# Patient Record
Sex: Female | Born: 1970 | Race: White | Hispanic: No | Smoking: Never smoker
Health system: Southern US, Community
[De-identification: ages and names within clinical notes are randomized; demographics above are authoritative.]

## PROBLEM LIST (undated history)

## (undated) ENCOUNTER — Emergency Department (HOSPITAL_COMMUNITY): Admission: EM | Payer: PRIVATE HEALTH INSURANCE | Source: Home / Self Care

## (undated) HISTORY — PX: TONSILLECTOMY AND ADENOIDECTOMY: SHX28

## (undated) HISTORY — PX: DILATION AND CURETTAGE OF UTERUS: SHX78

---

## 2000-05-04 DIAGNOSIS — R87629 Unspecified abnormal cytological findings in specimens from vagina: Secondary | ICD-10-CM

## 2000-05-04 HISTORY — DX: Unspecified abnormal cytological findings in specimens from vagina: R87.629

## 2000-09-06 ENCOUNTER — Other Ambulatory Visit: Admission: RE | Admit: 2000-09-06 | Discharge: 2000-09-06 | Payer: Self-pay | Admitting: Family Medicine

## 2000-10-13 ENCOUNTER — Other Ambulatory Visit: Admission: RE | Admit: 2000-10-13 | Discharge: 2000-10-13 | Payer: Self-pay | Admitting: Obstetrics and Gynecology

## 2004-12-15 ENCOUNTER — Ambulatory Visit: Payer: Self-pay | Admitting: Orthopedic Surgery

## 2010-04-08 ENCOUNTER — Ambulatory Visit (HOSPITAL_COMMUNITY)
Admission: RE | Admit: 2010-04-08 | Discharge: 2010-04-08 | Payer: Self-pay | Source: Home / Self Care | Admitting: Obstetrics and Gynecology

## 2012-02-01 ENCOUNTER — Other Ambulatory Visit (HOSPITAL_COMMUNITY): Payer: Self-pay | Admitting: Unknown Physician Specialty

## 2012-02-01 DIAGNOSIS — Z139 Encounter for screening, unspecified: Secondary | ICD-10-CM

## 2012-02-05 ENCOUNTER — Ambulatory Visit (HOSPITAL_COMMUNITY)
Admission: RE | Admit: 2012-02-05 | Discharge: 2012-02-05 | Disposition: A | Discharge status: Home / Self Care | Payer: PRIVATE HEALTH INSURANCE | Source: Ambulatory Visit | Attending: Unknown Physician Specialty | Admitting: Unknown Physician Specialty

## 2012-02-05 DIAGNOSIS — Z1231 Encounter for screening mammogram for malignant neoplasm of breast: Secondary | ICD-10-CM | POA: Insufficient documentation

## 2012-02-05 DIAGNOSIS — Z139 Encounter for screening, unspecified: Secondary | ICD-10-CM

## 2013-01-23 ENCOUNTER — Other Ambulatory Visit (HOSPITAL_COMMUNITY): Payer: Self-pay | Admitting: Unknown Physician Specialty

## 2013-01-23 DIAGNOSIS — Z139 Encounter for screening, unspecified: Secondary | ICD-10-CM

## 2013-02-06 ENCOUNTER — Ambulatory Visit (HOSPITAL_COMMUNITY)
Admission: RE | Admit: 2013-02-06 | Discharge: 2013-02-06 | Disposition: A | Discharge status: Home / Self Care | Payer: PRIVATE HEALTH INSURANCE | Source: Ambulatory Visit | Attending: Unknown Physician Specialty | Admitting: Unknown Physician Specialty

## 2013-02-06 DIAGNOSIS — Z1231 Encounter for screening mammogram for malignant neoplasm of breast: Secondary | ICD-10-CM | POA: Insufficient documentation

## 2013-02-06 DIAGNOSIS — Z139 Encounter for screening, unspecified: Secondary | ICD-10-CM

## 2014-01-23 ENCOUNTER — Other Ambulatory Visit (HOSPITAL_COMMUNITY): Payer: Self-pay | Admitting: Unknown Physician Specialty

## 2014-01-23 DIAGNOSIS — Z139 Encounter for screening, unspecified: Secondary | ICD-10-CM

## 2014-02-09 ENCOUNTER — Ambulatory Visit (HOSPITAL_COMMUNITY)
Admission: RE | Admit: 2014-02-09 | Discharge: 2014-02-09 | Disposition: A | Discharge status: Home / Self Care | Payer: PRIVATE HEALTH INSURANCE | Source: Ambulatory Visit | Attending: Unknown Physician Specialty | Admitting: Unknown Physician Specialty

## 2014-02-09 DIAGNOSIS — Z139 Encounter for screening, unspecified: Secondary | ICD-10-CM

## 2014-02-09 DIAGNOSIS — Z1231 Encounter for screening mammogram for malignant neoplasm of breast: Secondary | ICD-10-CM | POA: Diagnosis not present

## 2015-01-15 ENCOUNTER — Other Ambulatory Visit (HOSPITAL_COMMUNITY): Payer: Self-pay | Admitting: Unknown Physician Specialty

## 2015-01-15 DIAGNOSIS — Z1231 Encounter for screening mammogram for malignant neoplasm of breast: Secondary | ICD-10-CM

## 2015-02-13 ENCOUNTER — Ambulatory Visit (HOSPITAL_COMMUNITY)
Admission: RE | Admit: 2015-02-13 | Discharge: 2015-02-13 | Disposition: A | Discharge status: Home / Self Care | Payer: PRIVATE HEALTH INSURANCE | Source: Ambulatory Visit | Attending: Unknown Physician Specialty | Admitting: Unknown Physician Specialty

## 2015-02-13 DIAGNOSIS — Z1231 Encounter for screening mammogram for malignant neoplasm of breast: Secondary | ICD-10-CM | POA: Diagnosis not present

## 2018-06-06 ENCOUNTER — Other Ambulatory Visit (HOSPITAL_COMMUNITY): Payer: Self-pay | Admitting: Family Medicine

## 2018-06-06 DIAGNOSIS — Z1231 Encounter for screening mammogram for malignant neoplasm of breast: Secondary | ICD-10-CM

## 2018-06-20 ENCOUNTER — Ambulatory Visit (HOSPITAL_COMMUNITY)
Admission: RE | Admit: 2018-06-20 | Discharge: 2018-06-20 | Disposition: A | Discharge status: Home / Self Care | Payer: 59 | Source: Ambulatory Visit | Attending: Family Medicine | Admitting: Family Medicine

## 2018-06-20 DIAGNOSIS — Z1231 Encounter for screening mammogram for malignant neoplasm of breast: Secondary | ICD-10-CM | POA: Insufficient documentation

## 2019-02-10 ENCOUNTER — Other Ambulatory Visit: Payer: Self-pay | Admitting: *Deleted

## 2019-02-10 DIAGNOSIS — Z20822 Contact with and (suspected) exposure to covid-19: Secondary | ICD-10-CM

## 2019-02-11 LAB — NOVEL CORONAVIRUS, NAA: SARS-CoV-2, NAA: NOT DETECTED

## 2019-03-16 ENCOUNTER — Other Ambulatory Visit: Payer: Self-pay

## 2019-03-16 DIAGNOSIS — Z20822 Contact with and (suspected) exposure to covid-19: Secondary | ICD-10-CM

## 2019-03-18 ENCOUNTER — Telehealth: Payer: Self-pay

## 2019-03-18 LAB — NOVEL CORONAVIRUS, NAA: SARS-CoV-2, NAA: NOT DETECTED

## 2019-03-18 NOTE — Telephone Encounter (Signed)
Pt called for results. Advised results are not back. Sent pt MyChart enrollment link.

## 2019-03-19 ENCOUNTER — Telehealth: Payer: Self-pay | Admitting: *Deleted

## 2019-03-19 NOTE — Telephone Encounter (Signed)
Pt notified that she is negative for covid-19. She voiced understanding. 

## 2019-07-19 ENCOUNTER — Ambulatory Visit (INDEPENDENT_AMBULATORY_CARE_PROVIDER_SITE_OTHER): Payer: 59 | Admitting: Adult Health

## 2019-07-19 ENCOUNTER — Other Ambulatory Visit: Payer: Self-pay

## 2019-07-19 ENCOUNTER — Encounter: Payer: Self-pay | Admitting: Adult Health

## 2019-07-19 VITALS — BP 132/78 | HR 90 | Ht 65.0 in | Wt 187.2 lb

## 2019-07-19 DIAGNOSIS — Z1211 Encounter for screening for malignant neoplasm of colon: Secondary | ICD-10-CM

## 2019-07-19 DIAGNOSIS — F329 Major depressive disorder, single episode, unspecified: Secondary | ICD-10-CM | POA: Insufficient documentation

## 2019-07-19 DIAGNOSIS — F32A Depression, unspecified: Secondary | ICD-10-CM | POA: Insufficient documentation

## 2019-07-19 DIAGNOSIS — Z01419 Encounter for gynecological examination (general) (routine) without abnormal findings: Secondary | ICD-10-CM

## 2019-07-19 DIAGNOSIS — Z1212 Encounter for screening for malignant neoplasm of rectum: Secondary | ICD-10-CM

## 2019-07-19 LAB — HEMOCCULT GUIAC POC 1CARD (OFFICE): Fecal Occult Blood, POC: NEGATIVE

## 2019-07-19 MED ORDER — ESCITALOPRAM OXALATE 10 MG PO TABS: 10.0000 mg | ORAL_TABLET | Freq: Every day | ORAL | 2 refills | Status: DC

## 2019-07-19 NOTE — Progress Notes (Signed)
Patient ID: Jennifer Sweeney, female   DOB: 1970-08-13, 49 y.o.   MRN: [id number] History of Present Illness: Jennifer Sweeney is a 49 year old white female,divorced, G5P3, in for a well woman gyn exam,she had pap with Dr Mora Appl last in 2019 or 2020.She is on OCs for hot flashes.She is new pt. She has tried other meds in the past for depression, Wellbutrin made her mean, did OK on lexapro.  PCP is Dr Phillips Odor.    Current Medications, Allergies, Past Medical History, Past Surgical History, Family History and Social History were reviewed in [employer] record.     Review of Systems:  Patient denies any headaches, hearing loss, fatigue, blurred vision, shortness of breath, chest pain, abdominal pain, problems with bowel movements, urination, or intercourse(not active). No joint pain or mood swings. +depression.    Physical Exam:BP 132/78 (BP Location: Left Arm, Patient Position: Sitting, Cuff Size: Normal)   Pulse 90   Ht 5\' 5"  (1.651 m)   Wt 187 lb 3.2 oz (84.9 kg)   BMI 31.15 kg/m  General:  Well developed, well nourished, no acute distress Skin:  Warm and dry Neck:  Midline trachea, normal thyroid, good ROM, no lymphadenopathy Lungs; Clear to auscultation bilaterally Breast:  No dominant palpable mass, retraction, or nipple discharge Cardiovascular: Regular rate and rhythm Abdomen:  Soft, non tender, no hepatosplenomegaly Pelvic:  External genitalia is normal in appearance, no lesions.  The vagina is normal in appearance. Urethra has no lesions or masses. The cervix is smooth.  Uterus is felt to be normal size, shape, and contour.  No adnexal masses or tenderness noted.Bladder is non tender, no masses felt. Rectal: Good sphincter tone, no polyps, or hemorrhoids felt.  Hemoccult negative. Extremities/musculoskeletal:  No swelling or varicosities noted, no clubbing or cyanosis Psych:  No mood changes, alert and cooperative,seems happy Fall risk is low PHQ 9 score is 16, is  open it meds no SI or HI. Examination chaperoned by CMA  Impression and Plan: 1. Encounter for well woman exam with routine gynecological exam Pap and physical in 1 year Labs with PCP Mammogram every year  2. Screening for colorectal cancer  3. Depression, unspecified depression type Will rx lexapro  Meds ordered this encounter  Medications  . escitalopram (LEXAPRO) 10 MG tablet    Sig: Take 1 tablet (10 mg total) by mouth daily.    Dispense:  30 tablet    Refill:  2    Order Specific Question:   Supervising Provider    Answer:   Ishmael Holter [[id number]]  Call me in about 2-3 months for up date, can increase if needed.

## 2019-08-14 ENCOUNTER — Other Ambulatory Visit: Payer: Self-pay | Admitting: Adult Health

## 2019-10-16 DIAGNOSIS — D225 Melanocytic nevi of trunk: Secondary | ICD-10-CM | POA: Diagnosis not present

## 2019-10-16 DIAGNOSIS — D1801 Hemangioma of skin and subcutaneous tissue: Secondary | ICD-10-CM | POA: Diagnosis not present

## 2020-02-11 ENCOUNTER — Other Ambulatory Visit: Payer: Self-pay | Admitting: Adult Health

## 2020-03-11 ENCOUNTER — Other Ambulatory Visit (HOSPITAL_COMMUNITY): Payer: Self-pay | Admitting: Adult Health

## 2020-03-11 DIAGNOSIS — Z1231 Encounter for screening mammogram for malignant neoplasm of breast: Secondary | ICD-10-CM

## 2020-03-15 ENCOUNTER — Ambulatory Visit (HOSPITAL_COMMUNITY)
Admission: RE | Admit: 2020-03-15 | Discharge: 2020-03-15 | Disposition: A | Discharge status: Home / Self Care | Payer: BC Managed Care – PPO | Source: Ambulatory Visit | Attending: Adult Health | Admitting: Adult Health

## 2020-03-15 ENCOUNTER — Other Ambulatory Visit: Payer: Self-pay

## 2020-03-15 DIAGNOSIS — Z1231 Encounter for screening mammogram for malignant neoplasm of breast: Secondary | ICD-10-CM

## 2020-03-20 DIAGNOSIS — E6609 Other obesity due to excess calories: Secondary | ICD-10-CM | POA: Diagnosis not present

## 2020-03-20 DIAGNOSIS — Z6831 Body mass index (BMI) 31.0-31.9, adult: Secondary | ICD-10-CM | POA: Diagnosis not present

## 2020-03-20 DIAGNOSIS — M7052 Other bursitis of knee, left knee: Secondary | ICD-10-CM | POA: Diagnosis not present

## 2020-03-20 DIAGNOSIS — M76822 Posterior tibial tendinitis, left leg: Secondary | ICD-10-CM | POA: Diagnosis not present

## 2020-05-01 ENCOUNTER — Encounter: Payer: Self-pay | Admitting: Adult Health

## 2020-05-01 ENCOUNTER — Other Ambulatory Visit: Payer: Self-pay

## 2020-05-01 ENCOUNTER — Other Ambulatory Visit (HOSPITAL_COMMUNITY)
Admission: RE | Admit: 2020-05-01 | Discharge: 2020-05-01 | Disposition: A | Discharge status: Home / Self Care | Payer: BC Managed Care – PPO | Source: Ambulatory Visit | Attending: Adult Health | Admitting: Adult Health

## 2020-05-01 ENCOUNTER — Ambulatory Visit (INDEPENDENT_AMBULATORY_CARE_PROVIDER_SITE_OTHER): Payer: BC Managed Care – PPO | Admitting: Adult Health

## 2020-05-01 VITALS — BP 131/81 | HR 68 | Ht 65.0 in | Wt 188.0 lb

## 2020-05-01 DIAGNOSIS — R1031 Right lower quadrant pain: Secondary | ICD-10-CM | POA: Diagnosis not present

## 2020-05-01 DIAGNOSIS — N898 Other specified noninflammatory disorders of vagina: Secondary | ICD-10-CM

## 2020-05-01 MED ORDER — CYCLOBENZAPRINE HCL 5 MG PO TABS: 5.0000 mg | ORAL_TABLET | Freq: Three times a day (TID) | ORAL | 0 refills | Status: DC | PRN

## 2020-05-01 NOTE — Progress Notes (Addendum)
  Subjective:     Patient ID: Jennifer Sweeney, female   DOB: [birth year], 49 y.o.   MRN: [id number]  HPI Jennifer Sweeney is a 49 year old white female, divorced, [id number] in complaining of pain right side since Monday morning. She is on OCs.  PCP is Dr Agapito Games.  Review of Systems Pain right side near umbilicus since Monday, woke up with sharp pain Denies any fever, nausea, vomiting or diarrhea  Last BM was Saturday but that is normal for her  No sex in 5 years Reviewed past medical,surgical, social and family history. Reviewed medications and allergies.     Objective:   Physical Exam BP 131/81 (BP Location: Left Arm, Patient Position: Sitting, Cuff Size: Normal)   Pulse 68   Ht 5\' 5"  (1.651 m)   Wt 188 lb (85.3 kg)   BMI 31.28 kg/m  Skin warm and dry.Pelvic: external genitalia is normal in appearance no lesions, vagina: creamy discharge without odor,urethra has no lesions or masses noted, cervix:smooth and bulbous,no CMT, uterus: normal size, shape and contour, non tender, no masses felt, adnexa: no masses or tenderness noted. Bladder is non tender and no masses felt. CV swab obtained. On abdomen exam has tenderness in abdominal wall on right about 3 FB from umbilicus, no rebound and no hernia felt. Fall risk is low  Upstream - 05/01/20 1625      Pregnancy Intention Screening   Does the patient want to become pregnant in the next year? No    Does the patient's partner want to become pregnant in the next year? No    Would the patient like to discuss contraceptive options today? No      Contraception Wrap Up   Current Method Oral Contraceptive    End Method Oral Contraceptive    Contraception Counseling Provided No          .Examination chaperoned by 05/03/20 LPN    Assessment:     1. RLQ abdominal pain Will get pelvic Malachy Mood at [facility] to assess uterus and ovaries, system down today,will call in am appt 05/08/20 at 3:30 pm.pt aware.  2. Abdominal wall pain in right lower  quadrant Will rx flexeril Meds ordered this encounter  Medications  . cyclobenzaprine (FLEXERIL) 5 MG tablet    Sig: Take 1 tablet (5 mg total) by mouth 3 (three) times daily as needed for muscle spasms.    Dispense:  30 tablet    Refill:  0    Order Specific Question:   Supervising Provider    Answer:   07/06/20, LUTHER H [[id number]]    3. Vaginal discharge CV swab sent    Plan:     Will talk when Jennifer Sweeney results back

## 2020-05-02 ENCOUNTER — Telehealth: Payer: Self-pay | Admitting: Adult Health

## 2020-05-02 NOTE — Telephone Encounter (Signed)
Pt aware that [place] is 05/08/20 at 3:30 pm at [facility], have full bladder

## 2020-05-05 LAB — CERVICOVAGINAL ANCILLARY ONLY
Bacterial Vaginitis (gardnerella): NEGATIVE
Candida Glabrata: NEGATIVE
Candida Vaginitis: NEGATIVE
Chlamydia: NEGATIVE
Comment: NEGATIVE
Comment: NEGATIVE
Comment: NEGATIVE
Comment: NEGATIVE
Comment: NEGATIVE
Comment: NORMAL
Neisseria Gonorrhea: NEGATIVE
Trichomonas: NEGATIVE

## 2020-05-08 ENCOUNTER — Ambulatory Visit (HOSPITAL_COMMUNITY)
Admission: RE | Admit: 2020-05-08 | Discharge: 2020-05-08 | Disposition: A | Discharge status: Home / Self Care | Payer: BC Managed Care – PPO | Source: Ambulatory Visit | Attending: Adult Health | Admitting: Adult Health

## 2020-05-08 ENCOUNTER — Other Ambulatory Visit: Payer: Self-pay

## 2020-05-08 DIAGNOSIS — Z780 Asymptomatic menopausal state: Secondary | ICD-10-CM | POA: Diagnosis not present

## 2020-05-08 DIAGNOSIS — R1031 Right lower quadrant pain: Secondary | ICD-10-CM

## 2020-05-13 ENCOUNTER — Encounter: Payer: Self-pay | Admitting: *Deleted

## 2020-05-13 ENCOUNTER — Encounter: Payer: Self-pay | Admitting: Obstetrics & Gynecology

## 2020-05-13 ENCOUNTER — Ambulatory Visit (INDEPENDENT_AMBULATORY_CARE_PROVIDER_SITE_OTHER): Payer: BC Managed Care – PPO | Admitting: Obstetrics & Gynecology

## 2020-05-13 ENCOUNTER — Other Ambulatory Visit: Payer: Self-pay

## 2020-05-13 VITALS — BP 124/80 | HR 66 | Ht 65.0 in | Wt 186.0 lb

## 2020-05-13 DIAGNOSIS — R109 Unspecified abdominal pain: Secondary | ICD-10-CM | POA: Diagnosis not present

## 2020-05-13 DIAGNOSIS — G588 Other specified mononeuropathies: Secondary | ICD-10-CM

## 2020-05-13 NOTE — Progress Notes (Signed)
Trigger Point Injection   Pre-operative diagnosis: Abdominal wall pain, Right T10 lateral cutaneous nerve and Right iliohypogastric nerve pain  Post-operative diagnosis: same  After risks and benefits were explained including bleeding, infection, worsening of the pain, damage to the area being injected, weakness, allergic reaction to medications, vascular injection, and nerve damage, signed consent was obtained.  All questions were answered.    The area of the trigger point was identified and the skin prepped three times with alcohol and the alcohol allowed to dry.  Next, a 25 gauge 1.5 inch needle was placed in the area of the trigger point.  Once reproduction of the pain was elicited and negative aspiration confirmed, the trigger point was injected and the needle removed.    The patient   Medication used:  10 CC 0.5% marcaine in T10 trigger and 10 CC 0.5% marcaine in right iliohypogastric nerve distibution  Trigger points injected: 2    Trigger point(s) location(s):  Right T10 lat cutaneous and right iliohypogastic nerves

## 2020-06-03 ENCOUNTER — Ambulatory Visit: Payer: BC Managed Care – PPO | Admitting: Obstetrics & Gynecology

## 2020-08-21 ENCOUNTER — Other Ambulatory Visit: Payer: Self-pay | Admitting: Adult Health

## 2020-08-28 ENCOUNTER — Other Ambulatory Visit: Payer: Self-pay | Admitting: Adult Health

## 2020-08-28 ENCOUNTER — Telehealth: Payer: Self-pay | Admitting: Adult Health

## 2020-08-28 MED ORDER — NORETHIN ACE-ETH ESTRAD-FE 1-20 MG-MCG(24) PO CAPS: 1.0000 | ORAL_CAPSULE | Freq: Every day | ORAL | 3 refills | Status: DC

## 2020-08-28 NOTE — Progress Notes (Signed)
Refilled OCs 

## 2020-08-28 NOTE — Telephone Encounter (Signed)
Patient wants a call back to discuss a refill on birth control until scheduled PAP/Physical Appt in June.

## 2020-10-22 ENCOUNTER — Ambulatory Visit (INDEPENDENT_AMBULATORY_CARE_PROVIDER_SITE_OTHER): Payer: BC Managed Care – PPO | Admitting: Obstetrics & Gynecology

## 2020-10-22 ENCOUNTER — Other Ambulatory Visit: Payer: Self-pay

## 2020-10-22 ENCOUNTER — Other Ambulatory Visit (HOSPITAL_COMMUNITY)
Admission: RE | Admit: 2020-10-22 | Discharge: 2020-10-22 | Disposition: A | Discharge status: Home / Self Care | Payer: BC Managed Care – PPO | Source: Ambulatory Visit | Attending: Adult Health | Admitting: Adult Health

## 2020-10-22 ENCOUNTER — Encounter: Payer: Self-pay | Admitting: Obstetrics & Gynecology

## 2020-10-22 VITALS — BP 123/74 | HR 59 | Ht 65.0 in | Wt 176.0 lb

## 2020-10-22 DIAGNOSIS — Z1211 Encounter for screening for malignant neoplasm of colon: Secondary | ICD-10-CM | POA: Diagnosis not present

## 2020-10-22 DIAGNOSIS — Z01419 Encounter for gynecological examination (general) (routine) without abnormal findings: Secondary | ICD-10-CM

## 2020-10-22 DIAGNOSIS — N959 Unspecified menopausal and perimenopausal disorder: Secondary | ICD-10-CM | POA: Diagnosis not present

## 2020-10-22 DIAGNOSIS — Z1212 Encounter for screening for malignant neoplasm of rectum: Secondary | ICD-10-CM

## 2020-10-22 LAB — HEMOCCULT GUIAC POC 1CARD (OFFICE): Fecal Occult Blood, POC: NEGATIVE

## 2020-10-22 MED ORDER — NORETHIN ACE-ETH ESTRAD-FE 1-20 MG-MCG(24) PO CAPS: 1.0000 | ORAL_CAPSULE | Freq: Every day | ORAL | 4 refills | Status: DC

## 2020-10-22 NOTE — Progress Notes (Signed)
WELL-WOMAN EXAMINATION Patient name: Jennifer Sweeney MRN [id number]  Date of birth: December 04, 1970 Chief Complaint:   Gynecologic Exam (Pap/physcial)  History of Present Illness:   Jennifer Sweeney is a 50 y.o. [phone] female being seen today for a routine well-woman exam.  Today she notes: no acute complaints  HRT/Menopause: doing well with OCPs.  Not currently having menses and vasomotor symptoms well controlled.   No LMP recorded. (Menstrual status: Oral contraceptives). Denies issues with her menses The current method of family planning is OCP (estrogen/progesterone).    Last pap due this year.  Last mammogram: 03/2020. Last colonoscopy:   Depression screen [id number] 2/9 10/22/2020 07/19/2019  Decreased Interest 0 1  Down, Depressed, Hopeless 0 1  PHQ - 2 Score 0 2  Altered sleeping 1 3  Tired, decreased energy 1 2  Change in appetite 0 3  Feeling bad or failure about yourself  1 2  Trouble concentrating 0 3  Moving slowly or fidgety/restless 0 0  Suicidal thoughts 0 1  PHQ-9 Score 3 16  Difficult doing work/chores - Somewhat difficult      Review of Systems:   Pertinent items are noted in HPI Denies any headaches, blurred vision, fatigue, shortness of breath, chest pain, abdominal pain, bowel movements, urination, or intercourse unless otherwise stated above.  Pertinent History Reviewed:  Reviewed past medical,surgical, social and family history.  Reviewed problem list, medications and allergies. Physical Assessment:   Vitals:   10/22/20 1437  BP: 123/74  Pulse: (!) 59  Weight: 176 lb (79.8 kg)  Height: 5\' 5"  (1.651 m)  Body mass index is 29.29 kg/m.        Physical Examination:   General appearance - well appearing, and in no distress  Mental status - alert, oriented to person, place, and time  Psych:  She has a normal mood and affect  Skin - warm and dry, normal color, no suspicious lesions noted  Chest - effort normal, all lung fields clear to auscultation  bilaterally  Heart - normal rate and regular rhythm  Neck:  midline trachea, no thyromegaly or nodules  Breasts - breasts appear normal, no suspicious masses, no skin or nipple changes or  axillary nodes  Abdomen - soft, nontender, nondistended, no masses or organomegaly  Pelvic - VULVA: normal appearing vulva with no masses, tenderness or lesions  VAGINA: normal appearing vagina with normal color and discharge, no lesions  CERVIX: normal appearing cervix without discharge or lesions, no CMT  Thin prep pap is done with HR HPV cotesting  UTERUS: uterus is felt to be normal size, shape, consistency and nontender   ADNEXA: No adnexal masses or tenderness noted.  Rectal - normal rectal, good sphincter tone, no masses felt. Hemoccult: negative  Extremities:  No swelling or varicosities noted  Chaperone:     Assessment & Plan:  1) Well-Woman Exam -pap collected -mammogram due in November -discussed all testing options, hemocult done today- negative  2) Menopausal symptoms -reviewed pros/cons of OCPs -plan to continue for now, discussed readdressing in the new couple of years (@ 50yo) or if any change in her medical/family history  Orders Placed This Encounter  Procedures   POCT Occult Blood Stool    Meds:  Meds ordered this encounter  Medications   Norethin Ace-Eth Estrad-FE 1-20 MG-MCG(24) CAPS    Sig: Take 1 tablet by mouth daily.    Dispense:  90 capsule    Refill:  4    Follow-up: Return  in 1 year (on 10/22/2021) for Annual.   [name], DO Attending Obstetrician & Gynecologist, [facility] for [facility], [facility]

## 2020-10-24 LAB — CYTOLOGY - PAP
Comment: NEGATIVE
Diagnosis: NEGATIVE
High risk HPV: NEGATIVE

## 2021-01-20 DIAGNOSIS — Z03818 Encounter for observation for suspected exposure to other biological agents ruled out: Secondary | ICD-10-CM | POA: Diagnosis not present

## 2021-01-20 DIAGNOSIS — Z20822 Contact with and (suspected) exposure to covid-19: Secondary | ICD-10-CM | POA: Diagnosis not present

## 2021-01-22 DIAGNOSIS — J019 Acute sinusitis, unspecified: Secondary | ICD-10-CM | POA: Diagnosis not present

## 2021-01-23 DIAGNOSIS — Z03818 Encounter for observation for suspected exposure to other biological agents ruled out: Secondary | ICD-10-CM | POA: Diagnosis not present

## 2021-01-23 DIAGNOSIS — Z20822 Contact with and (suspected) exposure to covid-19: Secondary | ICD-10-CM | POA: Diagnosis not present

## 2021-02-17 ENCOUNTER — Other Ambulatory Visit: Payer: Self-pay | Admitting: Adult Health

## 2021-02-17 DIAGNOSIS — N959 Unspecified menopausal and perimenopausal disorder: Secondary | ICD-10-CM

## 2021-02-17 DIAGNOSIS — Z01419 Encounter for gynecological examination (general) (routine) without abnormal findings: Secondary | ICD-10-CM

## 2021-03-04 ENCOUNTER — Other Ambulatory Visit: Payer: Self-pay | Admitting: Adult Health

## 2021-07-07 DIAGNOSIS — Z03818 Encounter for observation for suspected exposure to other biological agents ruled out: Secondary | ICD-10-CM | POA: Diagnosis not present

## 2021-07-07 DIAGNOSIS — Z20822 Contact with and (suspected) exposure to covid-19: Secondary | ICD-10-CM | POA: Diagnosis not present

## 2021-10-30 ENCOUNTER — Ambulatory Visit: Payer: BC Managed Care – PPO | Admitting: Obstetrics & Gynecology

## 2021-11-05 ENCOUNTER — Encounter: Payer: Self-pay | Admitting: Obstetrics & Gynecology

## 2021-11-05 ENCOUNTER — Ambulatory Visit (INDEPENDENT_AMBULATORY_CARE_PROVIDER_SITE_OTHER): Payer: BC Managed Care – PPO | Admitting: Obstetrics & Gynecology

## 2021-11-05 VITALS — BP 114/71 | HR 78 | Ht 65.0 in | Wt 198.0 lb

## 2021-11-05 DIAGNOSIS — N959 Unspecified menopausal and perimenopausal disorder: Secondary | ICD-10-CM

## 2021-11-05 DIAGNOSIS — Z01419 Encounter for gynecological examination (general) (routine) without abnormal findings: Secondary | ICD-10-CM | POA: Diagnosis not present

## 2021-11-05 DIAGNOSIS — F320 Major depressive disorder, single episode, mild: Secondary | ICD-10-CM | POA: Diagnosis not present

## 2021-11-05 MED ORDER — ESCITALOPRAM OXALATE 10 MG PO TABS: 10.0000 mg | ORAL_TABLET | Freq: Every day | ORAL | 1 refills | Status: DC

## 2021-11-05 MED ORDER — NORETHIN ACE-ETH ESTRAD-FE 1-20 MG-MCG(24) PO CAPS: 1.0000 | ORAL_CAPSULE | Freq: Every day | ORAL | 4 refills | Status: DC

## 2021-11-05 NOTE — Progress Notes (Signed)
WELL-WOMAN EXAMINATION Patient name: Jennifer Sweeney MRN [id number]  Date of birth: August 18, 1970 Chief Complaint:   Gynecologic Exam  History of Present Illness:   Jennifer Sweeney is a 51 y.o. [id number] perimenopausal female being seen today for a routine well-woman exam and follow up regarding:  -HRT/Menopausal symptoms: doing well with OCPs.  Will have some spotting and hot flashes during the placebo pills.  When she was on [place] she didn't notice this, but had to switch due to insurance.  Symptoms are mild  Some mixed incontinence.  Drinks soda throughout the day- encouraged cutting back on caffeine  No LMP recorded. (Menstrual status: Oral contraceptives).  The current method of family planning is OCP (estrogen/progesterone).    Last pap 10/2020 neg.  Last mammogram: 11.2021 Last colonoscopy: declined, stool test yearly     11/05/2021    2:57 PM 10/22/2020    2:58 PM 07/19/2019    1:47 PM  Depression screen PHQ 2/9  Decreased Interest 1 0 1  Down, Depressed, Hopeless 2 0 1  PHQ - 2 Score 3 0 2  Altered sleeping 1 1 3   Tired, decreased energy 1 1 2   Change in appetite 3 0 3  Feeling bad or failure about yourself  1 1 2   Trouble concentrating 1 0 3  Moving slowly or fidgety/restless 0 0 0  Suicidal thoughts 0 0 1  PHQ-9 Score 10 3 16   Difficult doing work/chores   Somewhat difficult      Review of Systems:   Pertinent items are noted in HPI Denies any headaches, blurred vision, fatigue, shortness of breath, chest pain, abdominal pain, bowel movements, urination, or intercourse unless otherwise stated above.  Pertinent History Reviewed:  Reviewed past medical,surgical, social and family history.  Reviewed problem list, medications and allergies. Physical Assessment:   Vitals:   11/05/21 1457  BP: 114/71  Pulse: 78  Weight: 198 lb (89.8 kg)  Height: 5\' 5"  (1.651 m)  Body mass index is 32.95 kg/m.        Physical Examination:   General appearance - well  appearing, and in no distress  Mental status - alert, oriented to person, place, and time  Psych:  She has a normal mood and affect  Skin - warm and dry, normal color, no suspicious lesions noted  Chest - effort normal, all lung fields clear to auscultation bilaterally  Heart - normal rate and regular rhythm  Neck:  midline trachea, no thyromegaly or nodules  Breasts - breasts appear normal, no suspicious masses, no skin or nipple changes or  axillary nodes  Abdomen - soft, nontender, nondistended, no masses or organomegaly  Pelvic - VULVA: normal appearing vulva with no masses, tenderness or lesions  VAGINA: normal appearing vagina with normal color and discharge, no lesions  CERVIX: normal appearing cervix without discharge or lesions, no CMT  Pap up to date  UTERUS: uterus is felt to be normal size, shape, consistency and nontender   ADNEXA: No adnexal masses or tenderness noted.  Rectal - normal rectal, good sphincter tone, no masses felt. Hemoccult: negative  Extremities:  No swelling or varicosities noted  Chaperone:     Assessment & Plan:  1) Well-Woman Exam -pap up to date, reviewed screening guidelines -mammogram ordered -Hemocult negative  2) Vasomotor symptoms -continue with OCPs, medical history reviewed -as previously discussed will re-address @ 51yo -transition to continuous pills  -refill Lexapro until seen by PCP   Orders Placed This Encounter  Procedures   MM 3D SCREEN BREAST BILATERAL    Meds:  Meds ordered this encounter  Medications   Norethin Ace-Eth Estrad-FE 1-20 MG-MCG(24) CAPS    Sig: Take 1 tablet by mouth daily. Skip placebo    Dispense:  84 capsule    Refill:  4   escitalopram (LEXAPRO) 10 MG tablet    Sig: Take 1 tablet (10 mg total) by mouth daily.    Dispense:  90 tablet    Refill:  1    Follow-up: Return in about 1 year (around 11/06/2022) for Annual.   [name], DO Attending Obstetrician & Gynecologist, Faculty  [facility]ractice Center for [facility], [facility]

## 2021-12-24 IMAGING — MG DIGITAL SCREENING BILAT W/ TOMO W/ CAD
8 series · 8 of 24 positions shown · non-contrast
Comparison: Previous exam(s).

CLINICAL DATA: Screening.

EXAM:
DIGITAL SCREENING BILATERAL MAMMOGRAM WITH TOMO AND CAD

[R MLO synth-2D]
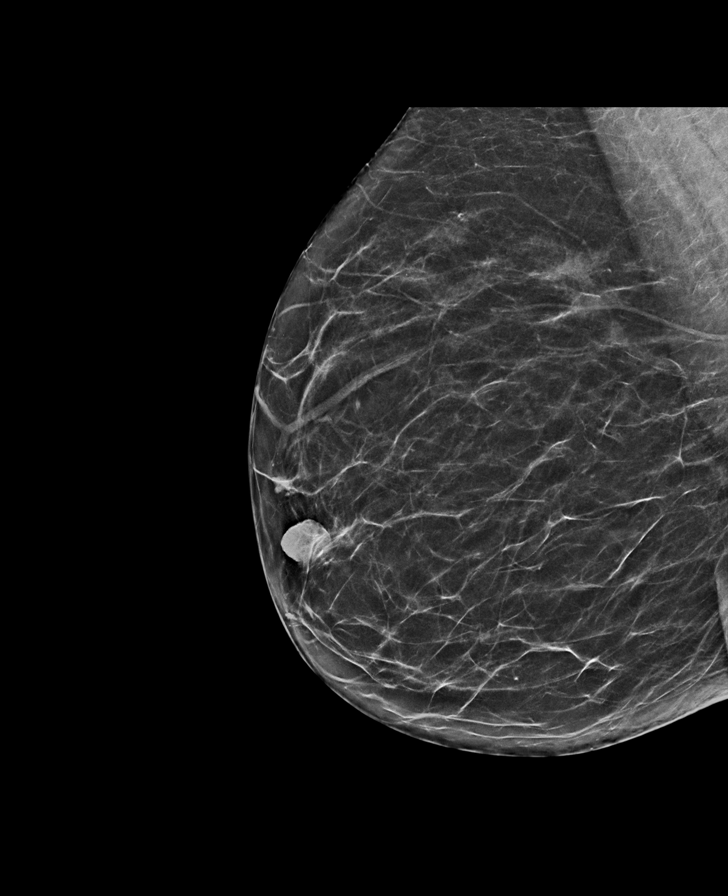

[L CC synth-2D]
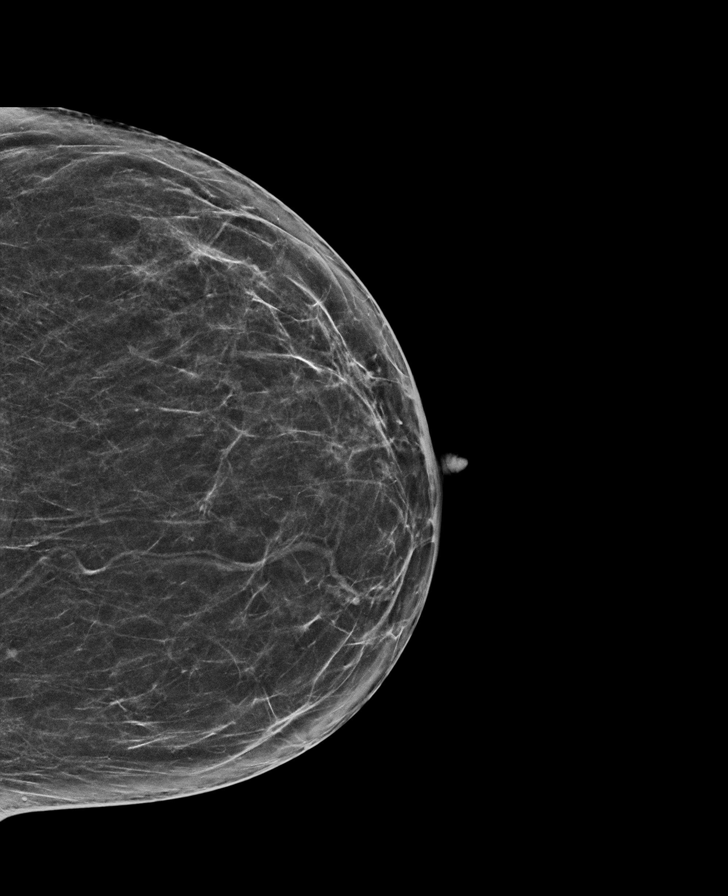

[L MLO synth-2D]
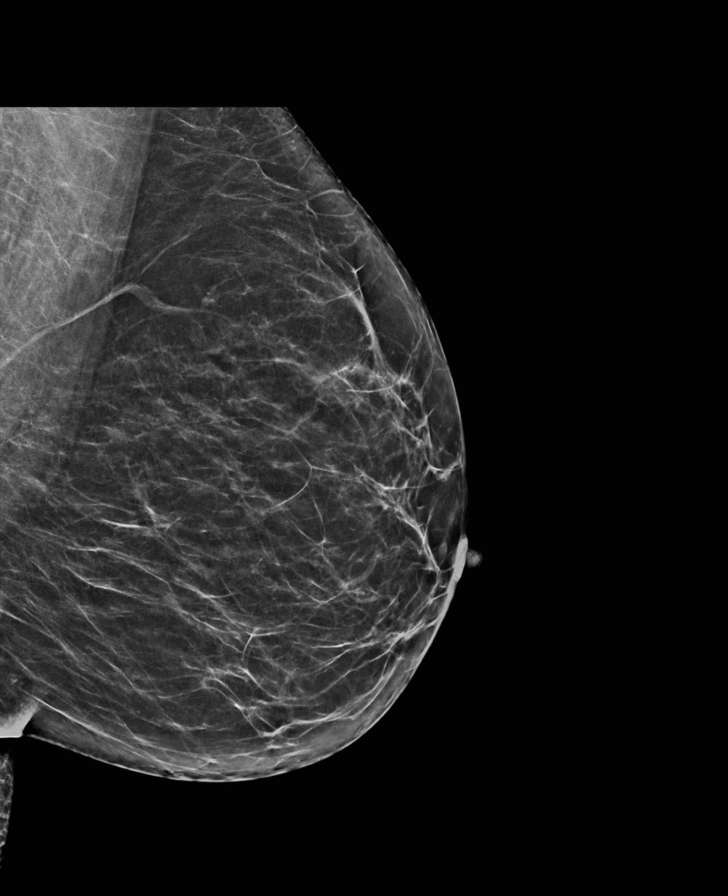

[R CC synth-2D]
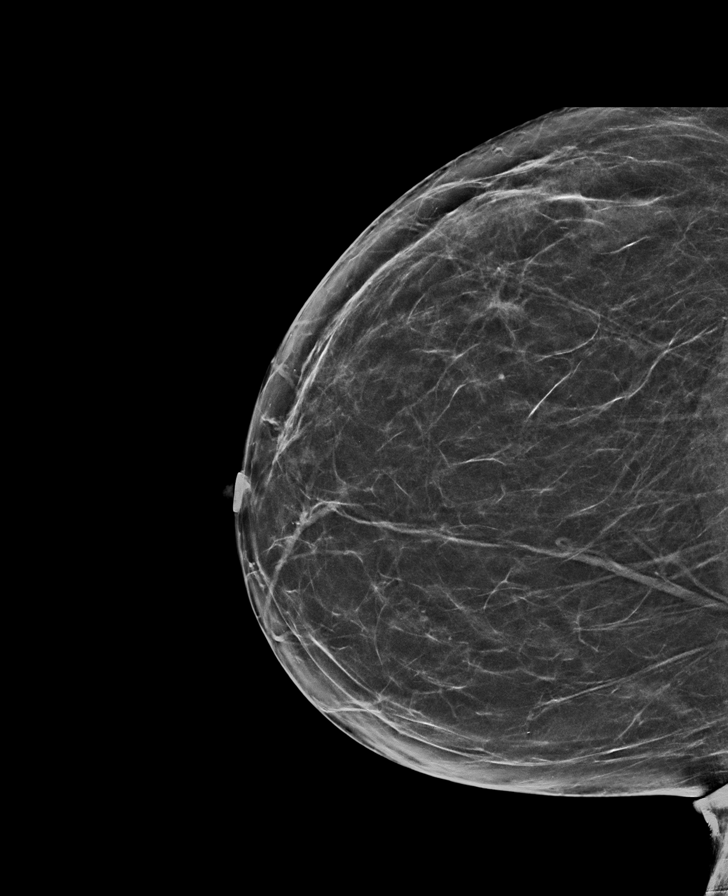

[R CC tomo · tomo slice 32/63.0]
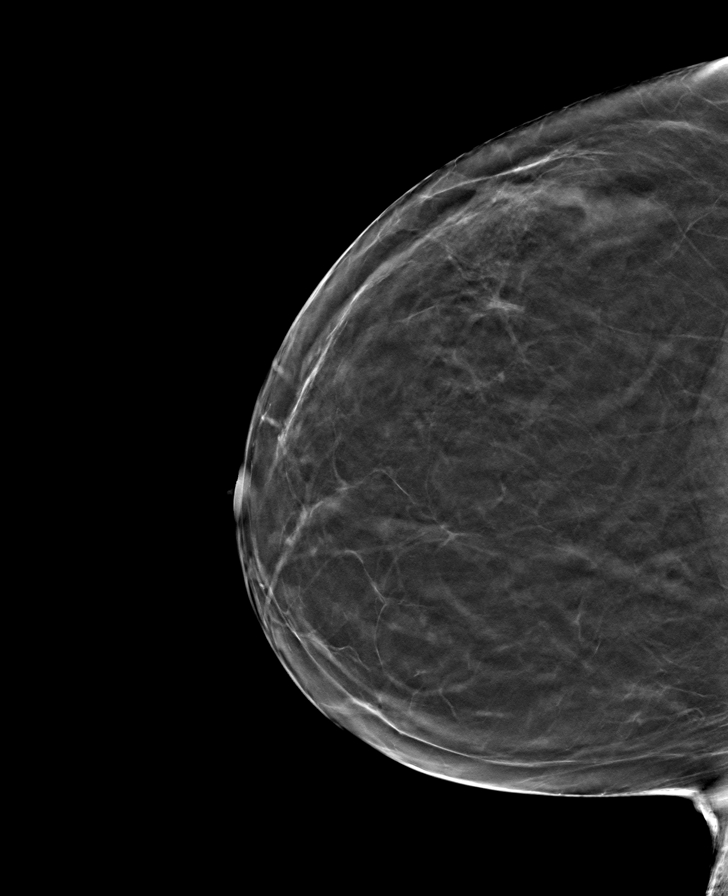

[L MLO tomo · tomo slice 35/68.0]
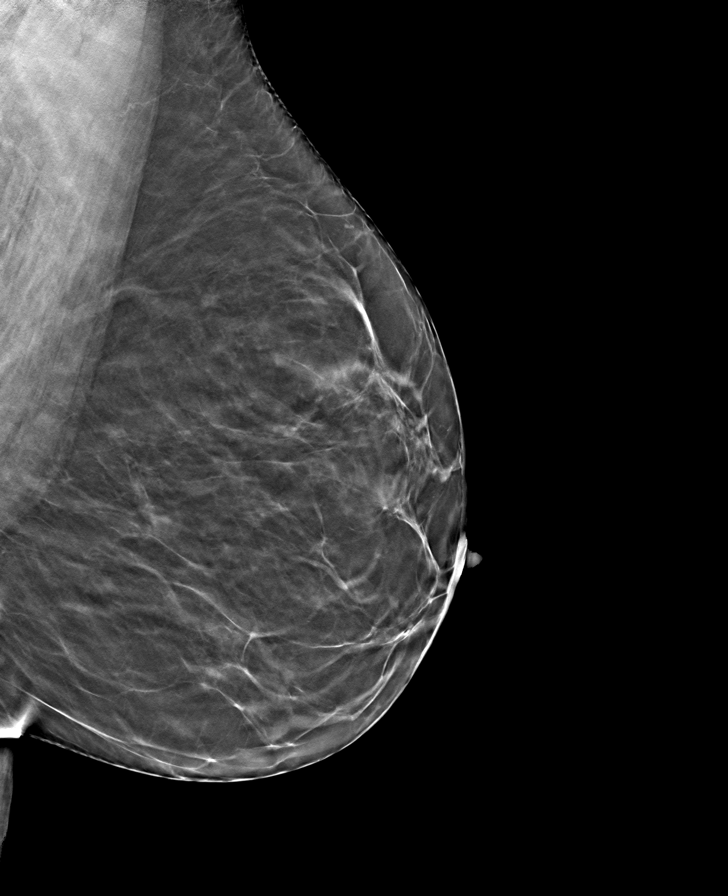

[L CC tomo · tomo slice 32/63.0]
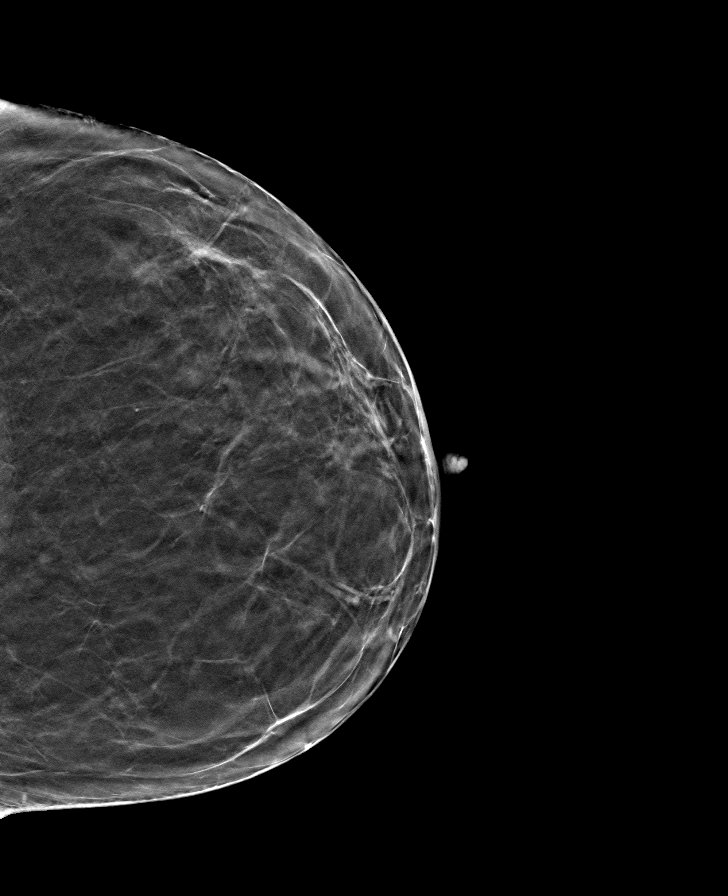

[R MLO tomo · tomo slice 33/66.0]
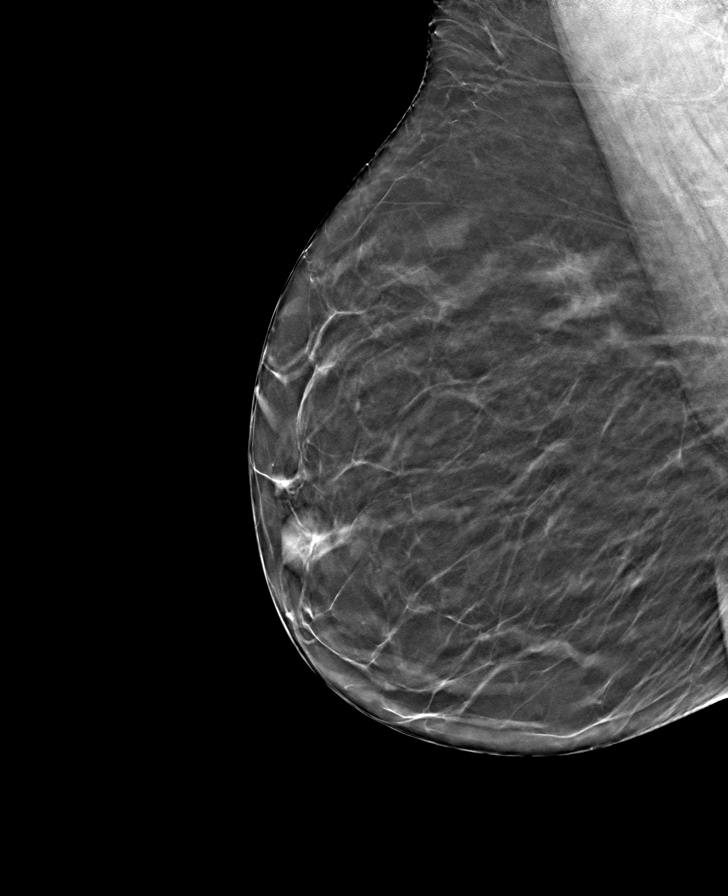

[8 of 24 positions shown; findings below may reference images not displayed]

ACR Breast Density Category b: There are scattered areas of
fibroglandular density.
FINDINGS: There are no findings suspicious for malignancy. Images were
processed with CAD.
IMPRESSION: No mammographic evidence of malignancy. A result letter of this
screening mammogram will be mailed directly to the patient.

RECOMMENDATION:
Screening mammogram in one year. (Code:CN-U-775)

BI-RADS CATEGORY  1: Negative.

## 2022-03-25 DIAGNOSIS — Z6834 Body mass index (BMI) 34.0-34.9, adult: Secondary | ICD-10-CM | POA: Diagnosis not present

## 2022-03-25 DIAGNOSIS — J069 Acute upper respiratory infection, unspecified: Secondary | ICD-10-CM | POA: Diagnosis not present

## 2022-03-25 DIAGNOSIS — E6609 Other obesity due to excess calories: Secondary | ICD-10-CM | POA: Diagnosis not present

## 2022-04-21 ENCOUNTER — Other Ambulatory Visit (HOSPITAL_COMMUNITY): Payer: Self-pay | Admitting: Family Medicine

## 2022-04-21 DIAGNOSIS — E6609 Other obesity due to excess calories: Secondary | ICD-10-CM | POA: Diagnosis not present

## 2022-04-21 DIAGNOSIS — E782 Mixed hyperlipidemia: Secondary | ICD-10-CM | POA: Diagnosis not present

## 2022-04-21 DIAGNOSIS — F321 Major depressive disorder, single episode, moderate: Secondary | ICD-10-CM | POA: Diagnosis not present

## 2022-04-21 DIAGNOSIS — Z1331 Encounter for screening for depression: Secondary | ICD-10-CM | POA: Diagnosis not present

## 2022-04-21 DIAGNOSIS — Z0000 Encounter for general adult medical examination without abnormal findings: Secondary | ICD-10-CM | POA: Diagnosis not present

## 2022-04-21 DIAGNOSIS — Z6834 Body mass index (BMI) 34.0-34.9, adult: Secondary | ICD-10-CM | POA: Diagnosis not present

## 2022-04-21 DIAGNOSIS — Z1231 Encounter for screening mammogram for malignant neoplasm of breast: Secondary | ICD-10-CM

## 2022-04-21 DIAGNOSIS — E7849 Other hyperlipidemia: Secondary | ICD-10-CM | POA: Diagnosis not present

## 2022-04-23 ENCOUNTER — Ambulatory Visit (HOSPITAL_COMMUNITY)
Admission: RE | Admit: 2022-04-23 | Discharge: 2022-04-23 | Disposition: A | Discharge status: Home / Self Care | Payer: BC Managed Care – PPO | Source: Ambulatory Visit | Attending: Family Medicine | Admitting: Family Medicine

## 2022-04-23 DIAGNOSIS — Z1231 Encounter for screening mammogram for malignant neoplasm of breast: Secondary | ICD-10-CM | POA: Diagnosis not present

## 2022-05-14 ENCOUNTER — Ambulatory Visit: Payer: Self-pay

## 2022-05-14 ENCOUNTER — Encounter: Payer: Self-pay | Admitting: Orthopedic Surgery

## 2022-05-14 ENCOUNTER — Ambulatory Visit (INDEPENDENT_AMBULATORY_CARE_PROVIDER_SITE_OTHER): Payer: BC Managed Care – PPO | Admitting: Orthopedic Surgery

## 2022-05-14 ENCOUNTER — Ambulatory Visit (INDEPENDENT_AMBULATORY_CARE_PROVIDER_SITE_OTHER): Payer: BC Managed Care – PPO

## 2022-05-14 DIAGNOSIS — M79671 Pain in right foot: Secondary | ICD-10-CM | POA: Diagnosis not present

## 2022-05-14 DIAGNOSIS — M79672 Pain in left foot: Secondary | ICD-10-CM

## 2022-05-14 DIAGNOSIS — M2021 Hallux rigidus, right foot: Secondary | ICD-10-CM | POA: Diagnosis not present

## 2022-05-14 DIAGNOSIS — M2022 Hallux rigidus, left foot: Secondary | ICD-10-CM | POA: Diagnosis not present

## 2022-05-14 NOTE — Progress Notes (Signed)
Office Visit Note   Patient: Jennifer Sweeney           Date of Birth: [birth year]           MRN: [id number] Visit Date: 05/14/2022              Requested by: Sharilyn Sites, MD [address] [place],  [place] [address] PCP: Sharilyn Sites, MD  Chief Complaint  Patient presents with   Left Foot - Pain   Right Foot - Pain      HPI: Patient is a 52 year old woman who is seen for initial evaluation for great toe MTP pain bilaterally.  Patient states she has had symptoms for 6 years worse on the left than the right patient states she occasionally has some shooting pain from the great toe MTP joint that radiates dorsally on the foot.  She has pain with ambulation.  Assessment & Plan: Visit Diagnoses:  1. Bilateral foot pain   2. Hallux rigidus, bilateral     Plan: Recommended a stiff soled sneaker recommended a carbon fiber plate.  Discussed that if she fails conservative treatment a cheilectomy to remove the bone spurs is a treatment option and if symptoms are not relieved with a cheilectomy a great toe MTP fusion is an option as well.  Follow-Up Instructions: Return if symptoms worsen or fail to improve.   Ortho Exam  Patient is alert, oriented, no adenopathy, well-dressed, normal affect, normal respiratory effort. Examination patient is ambulating in flexible slippers.  She has a palpable pulse bilaterally.  She is dorsiflexion 20 degrees past neutral of the ankle bilaterally.  She has good ankle and subtalar motion.  Patient has decreased range of motion of the great toe MTP joint with 45 degrees of dorsiflexion bilateral great toes.  She has palpable bony spurs worse on the left than the right at the MTP joint.  There is skin discoloration on the dorsum of the left great toe MTP joint.  There is no bunion deformity.  No cellulitis no pain with passive range of motion no clinical signs of gout.  Imaging: XR Foot 2 Views Right  Result Date: 05/14/2022 2 view radiographs of the  right foot shows joint space narrowing great toe MTP joint with subchondral sclerosis.  XR Foot 2 Views Left  Result Date: 05/14/2022 2 view radiographs of the left foot shows joint space narrowing great toe MTP joint with sclerosis.  No images are attached to the encounter.  Labs: No results found for: "HGBA1C", "ESRSEDRATE", "CRP", "LABURIC", "REPTSTATUS", "GRAMSTAIN", "CULT", "LABORGA"   No results found for: "ALBUMIN", "PREALBUMIN", "CBC"  No results found for: "MG" No results found for: "VD25OH"  No results found for: "PREALBUMIN"     No data to display           There is no height or weight on file to calculate BMI.  Orders:  Orders Placed This Encounter  Procedures   XR Foot 2 Views Right   XR Foot 2 Views Left   No orders of the defined types were placed in this encounter.    Procedures: No procedures performed  Clinical Data: No additional findings.  ROS:  All other systems negative, except as noted in the HPI. Review of Systems  Objective: Vital Signs: There were no vitals taken for this visit.  Specialty Comments:  No specialty comments available.  PMFS History: Patient Active Problem List   Diagnosis Date Noted   Abdominal wall pain in right lower quadrant 05/01/2020  RLQ abdominal pain 05/01/2020   Vaginal discharge 05/01/2020   Depression 07/19/2019   Screening for colorectal cancer 07/19/2019   Encounter for well woman exam with routine gynecological exam 07/19/2019   Past Medical History:  Diagnosis Date   Vaginal Pap smear, abnormal 2002   cry0    Family History  Problem Relation Age of Onset   Stomach cancer Mother    Heart disease Father    Heart disease Brother     Past Surgical History:  Procedure Laterality Date   DILATION AND CURETTAGE OF UTERUS     TONSILLECTOMY AND ADENOIDECTOMY     Social History   Occupational History   Not on file  Tobacco Use   Smoking status: Never   Smokeless tobacco: Never  Vaping  Use   Vaping Use: Never used  Substance and Sexual Activity   Alcohol use: Never   Drug use: Never   Sexual activity: Not Currently    Birth control/protection: Pill

## 2022-05-20 DIAGNOSIS — J019 Acute sinusitis, unspecified: Secondary | ICD-10-CM | POA: Diagnosis not present

## 2022-05-20 DIAGNOSIS — U071 COVID-19: Secondary | ICD-10-CM | POA: Diagnosis not present

## 2022-05-20 DIAGNOSIS — E7849 Other hyperlipidemia: Secondary | ICD-10-CM | POA: Diagnosis not present

## 2022-06-15 ENCOUNTER — Encounter (INDEPENDENT_AMBULATORY_CARE_PROVIDER_SITE_OTHER): Payer: Self-pay | Admitting: *Deleted

## 2022-08-13 ENCOUNTER — Other Ambulatory Visit: Payer: Self-pay | Admitting: Adult Health

## 2022-08-13 DIAGNOSIS — F320 Major depressive disorder, single episode, mild: Secondary | ICD-10-CM

## 2022-12-08 ENCOUNTER — Encounter (INDEPENDENT_AMBULATORY_CARE_PROVIDER_SITE_OTHER): Payer: Self-pay | Admitting: *Deleted

## 2022-12-16 ENCOUNTER — Ambulatory Visit: Payer: BC Managed Care – PPO | Admitting: Obstetrics & Gynecology

## 2022-12-16 ENCOUNTER — Encounter: Payer: Self-pay | Admitting: *Deleted

## 2022-12-16 ENCOUNTER — Encounter: Payer: Self-pay | Admitting: Obstetrics & Gynecology

## 2022-12-16 VITALS — BP 111/72 | HR 64 | Ht 65.0 in | Wt 200.8 lb

## 2022-12-16 DIAGNOSIS — Z01419 Encounter for gynecological examination (general) (routine) without abnormal findings: Secondary | ICD-10-CM | POA: Diagnosis not present

## 2022-12-16 DIAGNOSIS — Z1211 Encounter for screening for malignant neoplasm of colon: Secondary | ICD-10-CM | POA: Diagnosis not present

## 2022-12-16 DIAGNOSIS — N959 Unspecified menopausal and perimenopausal disorder: Secondary | ICD-10-CM | POA: Diagnosis not present

## 2022-12-16 MED ORDER — NORETHIN ACE-ETH ESTRAD-FE 1-20 MG-MCG(24) PO CAPS: 1.0000 | ORAL_CAPSULE | Freq: Every day | ORAL | 4 refills | Status: DC

## 2022-12-16 NOTE — Progress Notes (Signed)
WELL-WOMAN EXAMINATION Patient name: Jennifer Sweeney MRN [id number]  Date of birth: November 11, 1970 Chief Complaint:   Gynecologic Exam  History of Present Illness:   Jennifer Sweeney is a 52 y.o. [id number] perimenopausal female being seen today for a routine well-woman exam.   On OCPs for vasomotor symptoms and doing well (she can tell when she will miss a pill, symptoms will return).  No bleeding during placebo  Denies vaginal discharge, itching or irritation.  Denies pelvic or abdominal pain.  No sexual dysfunction no urinary concerns.  No acute GYN complaints  No LMP recorded. (Menstrual status: Oral contraceptives).    Last pap 10/2020.  Last mammogram: 04/2022. Last colonoscopy: To be completed     11/05/2021    2:57 PM 10/22/2020    2:58 PM 07/19/2019    1:47 PM  Depression screen PHQ 2/9  Decreased Interest 1 0 1  Down, Depressed, Hopeless 2 0 1  PHQ - 2 Score 3 0 2  Altered sleeping 1 1 3   Tired, decreased energy 1 1 2   Change in appetite 3 0 3  Feeling bad or failure about yourself  1 1 2   Trouble concentrating 1 0 3  Moving slowly or fidgety/restless 0 0 0  Suicidal thoughts 0 0 1  PHQ-9 Score 10 3 16   Difficult doing work/chores   Somewhat difficult      Review of Systems:   Pertinent items are noted in HPI Denies any headaches, blurred vision, fatigue, shortness of breath, chest pain, abdominal pain, bowel movements, urination, or intercourse unless otherwise stated above.  Pertinent History Reviewed:  Reviewed past medical,surgical, social and family history.  Reviewed problem list, medications and allergies. Physical Assessment:   Vitals:   12/16/22 0829  BP: 111/72  Pulse: 64  Weight: 200 lb 12.8 oz (91.1 kg)  Height: 5\' 5"  (1.651 m)  Body mass index is 33.41 kg/m.        Physical Examination:   General appearance - well appearing, and in no distress  Mental status - alert, oriented to person, place, and time  Psych:  She has a normal mood and  affect  Skin - warm and dry, normal color, no suspicious lesions noted  Chest - effort normal, all lung fields clear to auscultation bilaterally  Heart - normal rate and regular rhythm  Neck:  midline trachea, no thyromegaly or nodules  Breasts - breasts appear normal, no suspicious masses, no skin or nipple changes or  axillary nodes  Abdomen - soft, nontender, nondistended, no masses or organomegaly  Pelvic - VULVA: normal appearing vulva with no masses, tenderness or lesions  VAGINA: normal appearing vagina with normal color and discharge, no lesions  CERVIX: normal appearing cervix without discharge or lesions, no CMT  UTERUS: uterus is felt to be normal size, shape, consistency and nontender   ADNEXA: No adnexal masses or tenderness noted.  Extremities:  No swelling or varicosities noted  Chaperone:  Patient declined      Assessment & Plan:  1) Well-Woman Exam -Pap up-to-date, reviewed ASCCP guidelines -Mammogram due in December -Referral for colonoscopy completed  2) vasomotor symptoms -Doing well with current medication plan to continue -Discussed plan to work on weaning or switching to "HRT "over the next upcoming years (~ 52yo)  Orders Placed This Encounter  Procedures   Ambulatory referral to Gastroenterology    Meds:  Meds ordered this encounter  Medications   Norethin Ace-Eth Estrad-FE 1-20 MG-MCG(24) CAPS    Sig: Take  1 capsule by mouth daily. Skip placebo    Dispense:  84 capsule    Refill:  4    Follow-up: Return in about 1 year (around 12/16/2023) for Annual.   [name], DO Attending Obstetrician & Gynecologist, Faculty Practice Center for [facility], [facility]

## 2023-02-18 ENCOUNTER — Other Ambulatory Visit: Payer: Self-pay | Admitting: Adult Health

## 2023-02-18 DIAGNOSIS — F320 Major depressive disorder, single episode, mild: Secondary | ICD-10-CM

## 2023-06-22 ENCOUNTER — Encounter (INDEPENDENT_AMBULATORY_CARE_PROVIDER_SITE_OTHER): Payer: Self-pay | Admitting: *Deleted

## 2023-08-04 ENCOUNTER — Other Ambulatory Visit (HOSPITAL_COMMUNITY): Payer: Self-pay | Admitting: Family Medicine

## 2023-08-04 ENCOUNTER — Encounter (INDEPENDENT_AMBULATORY_CARE_PROVIDER_SITE_OTHER): Payer: Self-pay | Admitting: *Deleted

## 2023-08-04 DIAGNOSIS — Z6832 Body mass index (BMI) 32.0-32.9, adult: Secondary | ICD-10-CM | POA: Diagnosis not present

## 2023-08-04 DIAGNOSIS — Z0001 Encounter for general adult medical examination with abnormal findings: Secondary | ICD-10-CM | POA: Diagnosis not present

## 2023-08-04 DIAGNOSIS — E6609 Other obesity due to excess calories: Secondary | ICD-10-CM | POA: Diagnosis not present

## 2023-08-04 DIAGNOSIS — Z1331 Encounter for screening for depression: Secondary | ICD-10-CM | POA: Diagnosis not present

## 2023-08-04 DIAGNOSIS — Z0000 Encounter for general adult medical examination without abnormal findings: Secondary | ICD-10-CM | POA: Diagnosis not present

## 2023-08-04 DIAGNOSIS — F321 Major depressive disorder, single episode, moderate: Secondary | ICD-10-CM | POA: Diagnosis not present

## 2023-08-04 DIAGNOSIS — Z1231 Encounter for screening mammogram for malignant neoplasm of breast: Secondary | ICD-10-CM

## 2023-08-04 DIAGNOSIS — E782 Mixed hyperlipidemia: Secondary | ICD-10-CM | POA: Diagnosis not present

## 2023-08-04 DIAGNOSIS — F988 Other specified behavioral and emotional disorders with onset usually occurring in childhood and adolescence: Secondary | ICD-10-CM | POA: Diagnosis not present

## 2023-08-04 DIAGNOSIS — E7849 Other hyperlipidemia: Secondary | ICD-10-CM | POA: Diagnosis not present

## 2023-08-05 ENCOUNTER — Encounter: Payer: Self-pay | Admitting: *Deleted

## 2023-08-18 ENCOUNTER — Encounter (HOSPITAL_COMMUNITY): Payer: Self-pay

## 2023-08-18 ENCOUNTER — Ambulatory Visit (HOSPITAL_COMMUNITY)
Admission: RE | Admit: 2023-08-18 | Discharge: 2023-08-18 | Disposition: A | Discharge status: Home / Self Care | Source: Ambulatory Visit | Attending: Family Medicine | Admitting: Family Medicine

## 2023-08-18 DIAGNOSIS — Z1231 Encounter for screening mammogram for malignant neoplasm of breast: Secondary | ICD-10-CM | POA: Diagnosis not present

## 2023-08-23 ENCOUNTER — Other Ambulatory Visit (HOSPITAL_COMMUNITY): Payer: Self-pay | Admitting: Family Medicine

## 2023-08-23 DIAGNOSIS — R928 Other abnormal and inconclusive findings on diagnostic imaging of breast: Secondary | ICD-10-CM

## 2023-08-26 ENCOUNTER — Ambulatory Visit (HOSPITAL_COMMUNITY)
Admission: RE | Admit: 2023-08-26 | Discharge: 2023-08-26 | Disposition: A | Discharge status: Home / Self Care | Source: Ambulatory Visit | Attending: Family Medicine | Admitting: Family Medicine

## 2023-08-26 DIAGNOSIS — N6001 Solitary cyst of right breast: Secondary | ICD-10-CM | POA: Diagnosis not present

## 2023-08-26 DIAGNOSIS — N6311 Unspecified lump in the right breast, upper outer quadrant: Secondary | ICD-10-CM | POA: Diagnosis not present

## 2023-08-26 DIAGNOSIS — R928 Other abnormal and inconclusive findings on diagnostic imaging of breast: Secondary | ICD-10-CM | POA: Insufficient documentation

## 2024-02-08 ENCOUNTER — Ambulatory Visit: Admitting: Obstetrics & Gynecology

## 2024-02-16 DIAGNOSIS — Z23 Encounter for immunization: Secondary | ICD-10-CM | POA: Diagnosis not present

## 2024-02-16 DIAGNOSIS — F900 Attention-deficit hyperactivity disorder, predominantly inattentive type: Secondary | ICD-10-CM | POA: Diagnosis not present

## 2024-02-16 DIAGNOSIS — F329 Major depressive disorder, single episode, unspecified: Secondary | ICD-10-CM | POA: Diagnosis not present

## 2024-02-28 ENCOUNTER — Other Ambulatory Visit (HOSPITAL_COMMUNITY)
Admission: RE | Admit: 2024-02-28 | Discharge: 2024-02-28 | Disposition: A | Discharge status: Home / Self Care | Source: Ambulatory Visit | Attending: Obstetrics & Gynecology | Admitting: Obstetrics & Gynecology

## 2024-02-28 ENCOUNTER — Encounter: Payer: Self-pay | Admitting: Obstetrics & Gynecology

## 2024-02-28 ENCOUNTER — Ambulatory Visit: Admitting: Obstetrics & Gynecology

## 2024-02-28 ENCOUNTER — Encounter (INDEPENDENT_AMBULATORY_CARE_PROVIDER_SITE_OTHER): Payer: Self-pay | Admitting: *Deleted

## 2024-02-28 VITALS — BP 115/72 | HR 77 | Ht 65.0 in | Wt 162.2 lb

## 2024-02-28 DIAGNOSIS — Z1211 Encounter for screening for malignant neoplasm of colon: Secondary | ICD-10-CM | POA: Diagnosis not present

## 2024-02-28 DIAGNOSIS — Z124 Encounter for screening for malignant neoplasm of cervix: Secondary | ICD-10-CM | POA: Insufficient documentation

## 2024-02-28 DIAGNOSIS — Z1151 Encounter for screening for human papillomavirus (HPV): Secondary | ICD-10-CM

## 2024-02-28 DIAGNOSIS — Z01419 Encounter for gynecological examination (general) (routine) without abnormal findings: Secondary | ICD-10-CM | POA: Diagnosis not present

## 2024-02-28 DIAGNOSIS — N959 Unspecified menopausal and perimenopausal disorder: Secondary | ICD-10-CM

## 2024-02-28 NOTE — Progress Notes (Signed)
 WELL-WOMAN EXAMINATION Patient name: Jennifer Sweeney MRN [id number]  Date of birth: [birth year] Chief Complaint:   Annual Exam  History of Present Illness:   Jennifer Sweeney is a 53 y.o. [id number] PM female being seen today for a routine well-woman exam.   Stopped OCPs back in January initially ok, but then symptoms returned.  Notes considerable hot flashes- currently taking OTC herbal supplements and notes improvement.  Patient has started dating, sexually active without issues.  Denies vaginal dryness or dyspareunia.  She has noted some decreased libido, but overall feels this is tolerable.  Denies vaginal bleeding, discharge, itching or irritation.  Denies pelvic or abdominal pain.  Patient has been on a weight loss journey for the past several months.  Last month- notes that her stool color is different- looks lighter and not as firm.  On occasion will have stool incontinence.  States she is on a starvation diet and a times will only have a pc of bread; other days will have dinner.  Typically only have dinner 2-3 x per week.  Also notes increased alcohol use over the past month or so   No LMP recorded. Patient is perimenopausal.  The current method of family planning is post menopausal status.    Last pap 10/2020.  Last mammogram: 08/2023. Last colonoscopy: pt due     02/28/2024    8:40 AM 11/05/2021    2:57 PM 10/22/2020    2:58 PM 07/19/2019    1:47 PM  Depression screen PHQ 2/9  Decreased Interest 0 1 0 1  Down, Depressed, Hopeless 0 2 0 1  PHQ - 2 Score 0 3 0 2  Altered sleeping 2 1 1 3   Tired, decreased energy 2 1 1 2   Change in appetite 1 3 0 3  Feeling bad or failure about yourself  0 1 1 2   Trouble concentrating 1 1 0 3  Moving slowly or fidgety/restless 0 0 0 0  Suicidal thoughts 0 0 0 1  PHQ-9 Score 6 10 3 16   Difficult doing work/chores    Somewhat difficult      Review of Systems:   Pertinent items are noted in HPI Denies any headaches, blurred vision,  fatigue, shortness of breath, chest pain, abdominal pain, bowel movements, urination, or intercourse unless otherwise stated above.  Pertinent History Reviewed:  Reviewed past medical,surgical, social and family history.  Reviewed problem list, medications and allergies. Physical Assessment:   Vitals:   02/28/24 0834  BP: 115/72  Pulse: 77  Weight: 162 lb 3.2 oz (73.6 kg)  Height: 5' 5 (1.651 m)  Body mass index is 26.99 kg/m.        Physical Examination:   General appearance - well appearing, and in no distress  Mental status - alert, oriented to person, place, and time  Psych:  She has a normal mood and affect  Skin - warm and dry, normal color, no suspicious lesions noted  Chest - effort normal, all lung fields clear to auscultation bilaterally  Heart - normal rate and regular rhythm  Neck:  midline trachea, no thyromegaly or nodules  Breasts - breasts appear normal, no suspicious masses, no skin or nipple changes or  axillary nodes  Abdomen - soft, nontender, nondistended, no masses or organomegaly  Pelvic - VULVA: normal appearing vulva with no masses, tenderness or lesions  VAGINA: normal appearing vagina with normal color and discharge, no lesions  CERVIX: normal appearing cervix without discharge or lesions, no CMT  Thin prep pap is done with HR HPV cotesting  UTERUS: uterus is felt to be normal size, shape, consistency and nontender   ADNEXA: No adnexal masses or tenderness noted.  Extremities:  No swelling or varicosities noted  Chaperone: Aleck Blase     Assessment & Plan:  1) Well-Woman Exam -Pap collected, reviewed ASCCP guidelines - Mammogram up-to-date - Referral created for colonoscopy  2) weight management and nutrition - Discussed that while the weight loss is exciting, based on her diet concerned about vitamin deficiency - Discussed vitamin supplements as well as protein shakes with additional supportive ingredients - Referral sent to GI for  further evaluation regarding changes in her stool  3) menopausal symptoms - Patient has been doing well with herbal supplements and encouraged her to continue with this route of treatment - Briefly discussed HRT as well as testosterone, patient declined at this time  Orders Placed This Encounter  Procedures   Ambulatory referral to Gastroenterology    Meds: No orders of the defined types were placed in this encounter.   Follow-up: Return in about 1 year (around 02/27/2025) for Annual.   Geremy Rister, DO Attending Obstetrician & Gynecologist, [facility] for [facility], [facility]

## 2024-03-01 ENCOUNTER — Ambulatory Visit: Payer: Self-pay | Admitting: Obstetrics & Gynecology

## 2024-03-01 LAB — CYTOLOGY - PAP
Comment: NEGATIVE
Diagnosis: NEGATIVE
High risk HPV: NEGATIVE

## 2024-03-14 ENCOUNTER — Telehealth: Payer: Self-pay

## 2024-03-14 DIAGNOSIS — E785 Hyperlipidemia, unspecified: Secondary | ICD-10-CM | POA: Insufficient documentation

## 2024-03-14 DIAGNOSIS — E669 Obesity, unspecified: Secondary | ICD-10-CM | POA: Insufficient documentation

## 2024-03-14 DIAGNOSIS — F988 Other specified behavioral and emotional disorders with onset usually occurring in childhood and adolescence: Secondary | ICD-10-CM | POA: Insufficient documentation

## 2024-03-14 DIAGNOSIS — F32A Depression, unspecified: Secondary | ICD-10-CM | POA: Insufficient documentation

## 2024-03-14 NOTE — Telephone Encounter (Signed)
 Who is your primary care physician: Dr.Golding  Reasons for the colonoscopy: screening  Have you had a colonoscopy before?  no  Do you have family history of colon cancer? no  Previous colonoscopy with polyps removed? no  Do you have a history colorectal cancer?   no  Are you diabetic? If yes, Type 1 or Type 2?    no  Do you have a prosthetic or mechanical heart valve? no  Do you have a pacemaker/defibrillator?   no  Have you had endocarditis/atrial fibrillation? no  Have you had joint replacement within the last 12 months?  no  Do you tend to be constipated or have to use laxatives? no  Do you have any history of drugs or alchohol?  Yes 3 drinks a week  Do you use supplemental oxygen?  no  Have you had a stroke or heart attack within the last 6 months? no  Do you take weight loss medication?  no  For female patients: have you had a hysterectomy?  no                                     are you post menopausal?       yes                                            do you still have your menstrual cycle? no      Do you take any blood-thinning medications such as: (aspirin, warfarin, Plavix, Aggrenox)  no  If yes we need the name, milligram, dosage and who is prescribing doctor  Current Outpatient Medications on File Prior to Visit  Medication Sig Dispense Refill   escitalopram  (LEXAPRO ) 10 MG tablet TAKE 1 TABLET BY MOUTH EVERY DAY 90 tablet 1   methylphenidate 36 MG PO CR tablet Take 36 mg by mouth daily.     WELLBUTRIN XL 150 MG 24 hr tablet Take 150 mg by mouth daily.     No current facility-administered medications on file prior to visit.    Allergies  Allergen Reactions   Penicillins Nausea And Vomiting     Pharmacy: [facility]  Primary Insurance Name: CHARON [id number]  Best number where you can be reached: [phone]

## 2024-03-16 ENCOUNTER — Ambulatory Visit (INDEPENDENT_AMBULATORY_CARE_PROVIDER_SITE_OTHER)

## 2024-03-16 ENCOUNTER — Ambulatory Visit
Admission: EM | Admit: 2024-03-16 | Discharge: 2024-03-16 | Disposition: A | Discharge status: Home / Self Care | Attending: Nurse Practitioner | Admitting: Nurse Practitioner

## 2024-03-16 DIAGNOSIS — R0789 Other chest pain: Secondary | ICD-10-CM

## 2024-03-16 DIAGNOSIS — R0781 Pleurodynia: Secondary | ICD-10-CM | POA: Diagnosis not present

## 2024-03-16 NOTE — ED Triage Notes (Signed)
 Pt states left upper rib pain since this am while having sex.  States she heard a pop.  States pain is worse with inspiration and when she coughs.

## 2024-03-16 NOTE — Discharge Instructions (Addendum)
 The x-ray is pending.  You will be contacted if the pending x-ray is abnormal.  You will also have access to the results via MyChart. You may take over-the-counter Tylenol or ibuprofen as needed for pain or discomfort.  Recommend taking 600 to 800 mg of ibuprofen every 8 hours, you may take Tylenol 500 mg 1 to 2 hours after ibuprofen for breakthrough pain.  For severe pain, you may take the medications together. Recommend the use of ice or heat.  Apply ice for pain or swelling, heat for spasm or stiffness.  Apply for 20 minutes, remove for 1 hour, repeat as needed. Make sure you are coughing and deep breathing regularly to prevent the development of pneumonia.  You may need to use a pillow for splinting. Go to the emergency department if you experience worsening pain, shortness of breath, difficulty breathing, or other concerns. Please be advised that your pain can last up to 6 to 8 weeks.  If your symptoms fail to improve, recommend follow-up with your primary care physician for further evaluation. Follow-up as needed.

## 2024-03-16 NOTE — ED Provider Notes (Signed)
 [facility]    CSN: [id number] Arrival date & time: 03/16/24  9074      History   Chief Complaint Chief Complaint  Patient presents with   Rib Injury    HPI Jennifer Sweeney is a 53 y.o. female.   The history is provided by the patient.   Patient presents for complaints of left-sided rib cage pain.  Patient states symptoms started this morning during sexual intercourse.  States that her partner must have rolled over onto her, states that she felt a pop at that time.  Patient denies bruising, swelling, shortness of breath, or difficulty breathing.  She does endorse tenderness to the left rib cage under the left axilla and pain with coughing and deep breathing.  So far, she has not taken any medications for her symptoms.  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/14/2024   Obesity 03/14/2024   Vaginal Pap smear, abnormal 2002   cry0    Patient Active Problem List   Diagnosis Date Noted   Abdominal wall pain in right lower quadrant 05/01/2020   RLQ abdominal pain 05/01/2020   Vaginal discharge 05/01/2020   Depression 07/19/2019   Screening for colorectal cancer 07/19/2019   Encounter for well woman exam with routine gynecological exam 07/19/2019    Past Surgical History:  Procedure Laterality Date   DILATION AND CURETTAGE OF UTERUS     TONSILLECTOMY AND ADENOIDECTOMY      OB History     Gravida  5   Para  3   Term      Preterm      AB  2   Living  3      SAB      IAB      Ectopic      Multiple      Live Births               Home Medications    Prior to Admission medications   Medication Sig Start Date End Date Taking? Authorizing Provider  escitalopram  (LEXAPRO ) 10 MG tablet TAKE 1 TABLET BY MOUTH EVERY DAY 02/18/23   Signa Nest A, NP  methylphenidate 36 MG PO CR tablet Take 36 mg by mouth daily.    [provider]  WELLBUTRIN XL 150 MG 24 hr tablet Take 150 mg by mouth daily. 02/16/24   [provider]    Family History Family History  Problem Relation Age of Onset   Stomach cancer Mother    Heart disease Father    Heart disease Brother     Social History Social History   Tobacco Use   Smoking status: Never   Smokeless tobacco: Never  Vaping Use   Vaping status: Never Used  Substance Use Topics   Alcohol use: Yes   Drug use: Never     Allergies   Penicillins   Review of Systems Review of Systems Per HPI  Physical Exam Triage Vital Signs ED Triage Vitals  Encounter Vitals Group     BP 03/16/24 1104 117/82     Girls Systolic BP Percentile --      Girls Diastolic BP Percentile --      Boys Systolic BP Percentile --      Boys Diastolic BP Percentile --      Pulse Rate 03/16/24 1104 85     Resp 03/16/24 1104 16  Temp 03/16/24 1104 98.5 F (36.9 C)     Temp Source 03/16/24 1104 Oral     SpO2 03/16/24 1104 97 %     Weight --      Height --      Head Circumference --      Peak Flow --      Pain Score 03/16/24 1103 6     Pain Loc --      Pain Education --      Exclude from Growth Chart --    No data found.  Updated Vital Signs BP 117/82 (BP Location: Right Arm)   Pulse 85   Temp 98.5 F (36.9 C) (Oral)   Resp 16   SpO2 97%   Visual Acuity Right Eye Distance:   Left Eye Distance:   Bilateral Distance:    Right Eye Near:   Left Eye Near:    Bilateral Near:     Physical Exam Vitals and nursing note reviewed.  Constitutional:      General: She is not in acute distress.    Appearance: Normal appearance.  HENT:     Head: Normocephalic.  Eyes:     Extraocular Movements: Extraocular movements intact.     Pupils: Pupils are equal, round, and reactive to light.  Cardiovascular:     Rate and Rhythm: Normal rate and regular rhythm.     Pulses: Normal pulses.     Heart sounds: Normal heart sounds.  Pulmonary:     Effort: Pulmonary  effort is normal. No respiratory distress.     Breath sounds: Normal breath sounds. No stridor. No wheezing, rhonchi or rales.  Chest:    Abdominal:     General: Bowel sounds are normal.     Palpations: Abdomen is soft.  Musculoskeletal:     Cervical back: Normal range of motion.  Skin:    General: Skin is warm and dry.  Neurological:     General: No focal deficit present.     Mental Status: She is alert and oriented to person, place, and time.  Psychiatric:        Mood and Affect: Mood normal.        Behavior: Behavior normal.      UC Treatments / Results  Labs (all labs ordered are listed, but only abnormal results are displayed) Labs Reviewed - No data to display  EKG   Radiology No results found.  Procedures Procedures (including critical care time)  Medications Ordered in UC Medications - No data to display  Initial Impression / Assessment and Plan / UC Course  I have reviewed the triage vital signs and the nursing notes.  Pertinent labs & imaging results that were available during my care of the patient were reviewed by me and considered in my medical decision making (see chart for details).  X-ray of the left rib cage is pending.  On exam, the patient's lung sounds are clear throughout, room air sats are at 97%.  Advised patient that if the x-ray is positive for fracture, treatment will not change based on her current presentation.  Supportive care recommendations were provided and discussed with the patient to include the use of ice and heat, splinting, regular coughing and deep breathing, and over-the-counter analgesics for pain.  Patient was advised that pain can up to 6 to 8 weeks.  Patient was given strict ER follow-up precautions.  Patient was in agreement with this plan of care and verbalizes understanding.  All questions were answered.  Patient stable for discharge.   Final Clinical Impressions(s) / UC Diagnoses   Final diagnoses:  Rib pain on left  side   Discharge Instructions   None    ED Prescriptions   None    PDMP not reviewed this encounter.   Gilmer Etta PARAS, NP 03/16/24 1153

## 2024-04-10 NOTE — Telephone Encounter (Signed)
ASA 2.  ?

## 2024-04-11 NOTE — Telephone Encounter (Signed)
 LMOVM to return call.

## 2024-04-11 NOTE — Telephone Encounter (Signed)
 Pt is needing January to schedule. Will call once we get providers schedule.

## 2024-04-14 ENCOUNTER — Other Ambulatory Visit: Payer: Self-pay | Admitting: *Deleted

## 2024-04-14 MED ORDER — PEG 3350-KCL-NA BICARB-NACL 420 G PO SOLR: 4000.0000 mL | Freq: Once | ORAL | 0 refills | Status: AC

## 2024-05-17 ENCOUNTER — Encounter: Payer: Self-pay | Admitting: *Deleted

## 2024-05-17 NOTE — Telephone Encounter (Signed)
 Pt has been scheduled for 05/22/24 with Dr.Rourk. instructions sent via mychart and prep sent to pharmacy

## 2024-05-18 ENCOUNTER — Encounter (HOSPITAL_COMMUNITY)
Admission: RE | Admit: 2024-05-18 | Discharge: 2024-05-18 | Disposition: A | Discharge status: Home / Self Care | Source: Ambulatory Visit | Attending: Internal Medicine | Admitting: Internal Medicine

## 2024-05-18 ENCOUNTER — Encounter (INDEPENDENT_AMBULATORY_CARE_PROVIDER_SITE_OTHER): Payer: Self-pay | Admitting: *Deleted

## 2024-05-18 NOTE — Telephone Encounter (Signed)
 Referral completed, TCS apt letter sent to PCP

## 2024-05-22 ENCOUNTER — Ambulatory Visit (HOSPITAL_COMMUNITY): Admitting: Certified Registered"

## 2024-05-22 ENCOUNTER — Ambulatory Visit (HOSPITAL_COMMUNITY)
Admission: RE | Admit: 2024-05-22 | Discharge: 2024-05-22 | Disposition: A | Discharge status: Home / Self Care | Attending: Internal Medicine | Admitting: Internal Medicine

## 2024-05-22 ENCOUNTER — Encounter (HOSPITAL_COMMUNITY)
Admission: RE | Disposition: A | Discharge status: Home / Self Care | Payer: Self-pay | Source: Home / Self Care | Attending: Internal Medicine

## 2024-05-22 ENCOUNTER — Encounter (HOSPITAL_COMMUNITY): Payer: Self-pay | Admitting: Internal Medicine

## 2024-05-22 DIAGNOSIS — Z1211 Encounter for screening for malignant neoplasm of colon: Secondary | ICD-10-CM | POA: Diagnosis not present

## 2024-05-22 DIAGNOSIS — F32A Depression, unspecified: Secondary | ICD-10-CM | POA: Insufficient documentation

## 2024-05-22 HISTORY — PX: COLONOSCOPY: SHX5424

## 2024-05-22 MED ORDER — LACTATED RINGERS IV SOLN: INTRAVENOUS | Status: DC

## 2024-05-22 MED ORDER — LIDOCAINE HCL (CARDIAC) PF 100 MG/5ML IV SOSY
PREFILLED_SYRINGE | INTRAVENOUS | Status: DC | PRN
  Administered 2024-05-22: 50 mg via INTRAVENOUS

## 2024-05-22 MED ORDER — PROPOFOL 10 MG/ML IV BOLUS
INTRAVENOUS | Status: DC | PRN
  Administered 2024-05-22: 100 mg via INTRAVENOUS
  Administered 2024-05-22: 200 ug/kg/min via INTRAVENOUS

## 2024-05-22 NOTE — Anesthesia Preprocedure Evaluation (Addendum)
"                                    Anesthesia Evaluation  Patient identified by MRN, date of birth, ID band Patient awake    Reviewed: Allergy & Precautions, H&P , NPO status , Patient's Chart, lab work & pertinent test results  Airway Mallampati: II  TM Distance: >3 FB Neck ROM: Full    Dental no notable dental hx.    Pulmonary neg pulmonary ROS   Pulmonary exam normal breath sounds clear to auscultation       Cardiovascular negative cardio ROS Normal cardiovascular exam Rhythm:Regular Rate:Normal     Neuro/Psych  PSYCHIATRIC DISORDERS  Depression    negative neurological ROS     GI/Hepatic negative GI ROS, Neg liver ROS,,,  Endo/Other  negative endocrine ROS    Renal/GU negative Renal ROS  negative genitourinary   Musculoskeletal negative musculoskeletal ROS (+)    Abdominal   Peds negative pediatric ROS (+)  Hematology negative hematology ROS (+)   Anesthesia Other Findings   Reproductive/Obstetrics negative OB ROS                              Anesthesia Physical Anesthesia Plan  ASA: 1  Anesthesia Plan: General   Post-op Pain Management:    Induction: Intravenous  PONV Risk Score and Plan:   Airway Management Planned: Nasal Cannula and Natural Airway  Additional Equipment:   Intra-op Plan:   Post-operative Plan:   Informed Consent: I have reviewed the patients History and Physical, chart, labs and discussed the procedure including the risks, benefits and alternatives for the proposed anesthesia with the patient or authorized representative who has indicated his/her understanding and acceptance.     Dental advisory given  Plan Discussed with: CRNA  Anesthesia Plan Comments:          Anesthesia Quick Evaluation  "

## 2024-05-22 NOTE — Discharge Instructions (Signed)
" °  Colonoscopy Discharge Instructions  Read the instructions outlined below and refer to this sheet in the next few weeks. These discharge instructions provide you with general information on caring for yourself after you leave the hospital. Your doctor may also give you specific instructions. While your treatment has been planned according to the most current medical practices available, unavoidable complications occasionally occur. If you have any problems or questions after discharge, call Dr. Shaaron at [phone]. ACTIVITY You may resume your regular activity, but move at a slower pace for the next 24 hours.  Take frequent rest periods for the next 24 hours.  Walking will help get rid of the air and reduce the bloated feeling in your belly (abdomen).  No driving for 24 hours (because of the medicine (anesthesia) used during the test).   Do not sign any important legal documents or operate any machinery for 24 hours (because of the anesthesia used during the test).  NUTRITION Drink plenty of fluids.  You may resume your normal diet as instructed by your doctor.  Begin with a light meal and progress to your normal diet. Heavy or fried foods are harder to digest and may make you feel sick to your stomach (nauseated).  Avoid alcoholic beverages for 24 hours or as instructed.  MEDICATIONS You may resume your normal medications unless your doctor tells you otherwise.  WHAT YOU CAN EXPECT TODAY Some feelings of bloating in the abdomen.  Passage of more gas than usual.  Spotting of blood in your stool or on the toilet paper.  IF YOU HAD POLYPS REMOVED DURING THE COLONOSCOPY: No aspirin products for 7 days or as instructed.  No alcohol for 7 days or as instructed.  Eat a soft diet for the next 24 hours.  FINDING OUT THE RESULTS OF YOUR TEST Not all test results are available during your visit. If your test results are not back during the visit, make an appointment with your caregiver to find out the  results. Do not assume everything is normal if you have not heard from your caregiver or the medical facility. It is important for you to follow up on all of your test results.  SEEK IMMEDIATE MEDICAL ATTENTION IF: You have more than a spotting of blood in your stool.  Your belly is swollen (abdominal distention).  You are nauseated or vomiting.  You have a temperature over 101.  You have abdominal pain or discomfort that is severe or gets worse throughout the day.      Your prep was great and your colon appeared normal today  It is recommended you return for repeat screening colonoscopy in 10 years  At patient request, called Concetta Rosa at [phone] -reviewed findings and recommendations "

## 2024-05-22 NOTE — Op Note (Signed)
 [facility] Patient Name: Jennifer Sweeney Procedure Date: 05/22/2024 7:05 AM MRN: [id number] Date of Birth: 1971-01-09 Attending MD: Lamar Ozell Hollingshead , MD, [id number] CSN: [id number] Age: 54 Admit Type: Outpatient Procedure:                Colonoscopy Indications:              Screening for colorectal malignant neoplasm Providers:                Lamar Ozell Hollingshead, MD, Rosina Sprague, Daphne Mulch                            Technician, Technician Referring MD:              Medicines:                Propofol  per Anesthesia Complications:            No immediate complications. Estimated Blood Loss:     Estimated blood loss: none. Procedure:                Pre-Anesthesia Assessment:                           - Prior to the procedure, a History and Physical                            was performed, and patient medications and                            allergies were reviewed. The patient's tolerance of                            previous anesthesia was also reviewed. The risks                            and benefits of the procedure and the sedation                            options and risks were discussed with the patient.                            All questions were answered, and informed consent                            was obtained. Prior Anticoagulants: The patient has                            taken no anticoagulant or antiplatelet agents. ASA                            Grade Assessment: III - A patient with severe                            systemic disease. After reviewing the risks and  benefits, the patient was deemed in satisfactory                            condition to undergo the procedure.                           After obtaining informed consent, the colonoscope                            was passed under direct vision. Throughout the                            procedure, the patient's blood pressure, pulse, and                             oxygen saturations were monitored continuously. The                            [id number] ([id number]) Colon was introduced through                            the anus and advanced to the the cecum, identified                            by appendiceal orifice and ileocecal valve. The                            colonoscopy was performed without difficulty. The                            patient tolerated the procedure well. The quality                            of the bowel preparation was adequate. The                            ileocecal valve, appendiceal orifice, and rectum                            were photographed. Scope In: 7:36:33 AM Scope Out: 7:57:46 AM Scope Withdrawal Time: 0 hours 7 minutes 35 seconds  Total Procedure Duration: 0 hours 21 minutes 13 seconds  Findings:      The perianal and digital rectal examinations were normal.      The colon (entire examined portion) appeared normal.      The retroflexed view of the distal rectum and anal verge was normal and       showed no anal or rectal abnormalities. Impression:               - The entire examined colon is normal.                           - The distal rectum and anal verge are normal on  retroflexion view.                           - No specimens collected. Moderate Sedation:      Moderate (conscious) sedation was personally administered by an       anesthesia professional. The following parameters were monitored: oxygen       saturation, heart rate, blood pressure, respiratory rate, EKG, adequacy       of pulmonary ventilation, and response to care. Recommendation:           - Patient has a contact number available for                            emergencies. The signs and symptoms of potential                            delayed complications were discussed with the                            patient. Return to normal activities tomorrow.                            Written discharge  instructions were provided to the                            patient.                           - Advance diet as tolerated.                           - Continue present medications.                           - Repeat colonoscopy in 10 years for screening                            purposes.                           - Return to GI office PRN. Procedure Code(s):        --- Professional ---                           [phone], Colonoscopy, flexible; diagnostic, including                            collection of specimen(s) by brushing or washing,                            when performed (separate procedure) Diagnosis Code(s):        --- Professional ---                           Z12.11, Encounter for screening for malignant  neoplasm of colon CPT copyright 2022 [employer]. All rights reserved. The codes documented in this report are preliminary and upon coder review may  be revised to meet current compliance requirements. Lamar HERO. Makael Stein, MD Lamar Ozell Hollingshead, MD 05/22/2024 8:11:59 AM This report has been signed electronically. Number of Addenda: 0

## 2024-05-22 NOTE — H&P (Addendum)
 @LOGO @   Primary Care Physician:  Marvine Rush, MD Primary Gastroenterologist:  Dr. Shaaron  Pre-Procedure History & Physical: HPI:  Jennifer Sweeney is a 54 y.o. female is here for a screening colonoscopy.   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/14/2024   Obesity 03/14/2024   Vaginal Pap smear, abnormal 2002   cry0    Past Surgical History:  Procedure Laterality Date   DILATION AND CURETTAGE OF UTERUS     TONSILLECTOMY AND ADENOIDECTOMY      Prior to Admission medications  Medication Sig Start Date End Date Taking? Authorizing Provider  escitalopram  (LEXAPRO ) 10 MG tablet TAKE 1 TABLET BY MOUTH EVERY DAY 02/18/23   Signa Nest A, NP  methylphenidate 36 MG PO CR tablet Take 36 mg by mouth daily.    [provider]  WELLBUTRIN XL 150 MG 24 hr tablet Take 150 mg by mouth daily. 02/16/24   [provider]    Allergies as of 04/14/2024 - Review Complete 03/16/2024  Allergen Reaction Noted   Penicillins Nausea And Vomiting 06/23/2017    Family History  Problem Relation Age of Onset   Stomach cancer Mother    Heart disease Father    Heart disease Brother     Social History   Socioeconomic History   Marital status: Divorced    Spouse name: Not on file   Number of children: Not on file   Years of education: Not on file   Highest education level: Not on file  Occupational History   Not on file  Tobacco Use   Smoking status: Never   Smokeless tobacco: Never  Vaping Use   Vaping status: Never Used  Substance and Sexual Activity   Alcohol use: Yes   Drug use: Never   Sexual activity: Not Currently    Birth control/protection: None  Other Topics Concern   Not on file  Social History Narrative   Not on file   Social Drivers of Health   Tobacco Use: Low Risk (05/22/2024)   Patient History    Smoking Tobacco Use: Never    Smokeless  Tobacco Use: Never    Passive Exposure: Not on file  Financial Resource Strain: Low Risk (02/28/2024)   Overall Financial Resource Strain (CARDIA)    Difficulty of Paying Living Expenses: Not hard at all  Food Insecurity: No Food Insecurity (02/28/2024)   Epic    Worried About Radiation Protection Practitioner of Food in the Last Year: Never true    Ran Out of Food in the Last Year: Never true  Transportation Needs: No Transportation Needs (02/28/2024)   Epic    Lack of Transportation (Medical): No    Lack of Transportation (Non-Medical): No  Physical Activity: Inactive (02/28/2024)   Exercise Vital Sign    Days of Exercise per Week: 0 days    Minutes of Exercise per Session: 0 min  Stress: Stress Concern Present (02/28/2024)   [employer] of Occupational Health - Occupational Stress Questionnaire    Feeling of Stress: Rather much  Social Connections: Socially Isolated (02/28/2024)   Social Connection and Isolation Panel    Frequency of Communication with Friends and Family: Once a week    Frequency of Social Gatherings with Friends and Family: Once a week    Attends Religious Services: Never    Database Administrator or Organizations: Yes    Attends Banker Meetings: 1  to 4 times per year    Marital Status: Divorced  Intimate Partner Violence: Not At Risk (02/28/2024)   Epic    Fear of Current or Ex-Partner: No    Emotionally Abused: No    Physically Abused: No    Sexually Abused: No  Depression (PHQ2-9): Medium Risk (02/28/2024)   Depression (PHQ2-9)    PHQ-2 Score: 6  Alcohol Screen: Low Risk (02/28/2024)   Alcohol Screen    Last Alcohol Screening Score (AUDIT): 5  Housing: Low Risk (02/28/2024)   Epic    Unable to Pay for Housing in the Last Year: No    Number of Times Moved in the Last Year: 0    Homeless in the Last Year: No  Utilities: Not At Risk (02/28/2024)   Epic    Threatened with loss of utilities: No  Health Literacy: Not on file    Review of  Systems: See HPI, otherwise negative ROS  Physical Exam: BP 108/72 (BP Location: Right Arm)   Temp 98.4 F (36.9 C)   Resp 12   Ht 5' 5 (1.651 m)   Wt 68.9 kg   LMP 12/21/2014   SpO2 100%   BMI 25.29 kg/m  General:   Alert,  Well-developed, well-nourished, pleasant and cooperative in NAD Lungs:  Clear throughout to auscultation.   No wheezes, crackles, or rhonchi. No acute distress. Heart:  Regular rate and rhythm; no murmurs, clicks, rubs,  or gallops. Abdomen:  Soft, nontender and nondistended. No masses, hepatosplenomegaly or hernias noted. Normal bowel sounds, without guarding, and without rebound.   Msk:  Symmetrical without gross deformities. Normal posture. Pulses:  Normal pulses noted. Extremities:  Without clubbing or edema.  Impression/Plan: Jennifer Sweeney is now here to undergo a screening colonoscopy.  First ever average risk screening colonoscopy  Risks, benefits, limitations, imponderables and alternatives regarding colonoscopy have been reviewed with the patient. Questions have been answered. All parties agreeable.     Notice:  This dictation was prepared with Dragon dictation along with smaller phrase technology. Any transcriptional errors that result from this process are unintentional and may not be corrected upon review.

## 2024-05-22 NOTE — Anesthesia Postprocedure Evaluation (Signed)
"   Anesthesia Post Note  Patient: Jennifer Sweeney  Procedure(s) Performed: COLONOSCOPY  Patient location during evaluation: PACU Anesthesia Type: General Level of consciousness: awake and alert Pain management: pain level controlled Vital Signs Assessment: post-procedure vital signs reviewed and stable Respiratory status: spontaneous breathing, nonlabored ventilation, respiratory function stable and patient connected to nasal cannula oxygen Cardiovascular status: blood pressure returned to baseline and stable Postop Assessment: no apparent nausea or vomiting Anesthetic complications: no   No notable events documented.   Last Vitals:  Vitals:   05/22/24 0801 05/22/24 0806  BP: (!) 86/54 93/61  Pulse: 72   Resp: 12   Temp: 36.9 C   SpO2: 100%     Last Pain:  Vitals:   05/22/24 0801  TempSrc: Oral  PainSc: 0-No pain                 Andrea Limes      "

## 2024-05-22 NOTE — Transfer of Care (Signed)
 Immediate Anesthesia Transfer of Care Note  Patient: Jennifer Sweeney Brought  Procedure(s) Performed: COLONOSCOPY  Patient Location: Short Stay  Anesthesia Type:MAC  Level of Consciousness: awake, alert , oriented, and patient cooperative  Airway & Oxygen Therapy: Patient Spontanous Breathing  Post-op Assessment: Report given to RN and Post -op Vital signs reviewed and stable  Post vital signs: Reviewed and stable  Last Vitals:  Vitals Value Taken Time  BP 86/54 05/22/24 08:01  Temp 36.9 C 05/22/24 08:01  Pulse 72 05/22/24 08:01  Resp 12 05/22/24 08:01  SpO2 100 % 05/22/24 08:01    Last Pain:  Vitals:   05/22/24 0801  TempSrc: Oral  PainSc: 0-No pain         Complications: No notable events documented.

## 2024-05-23 ENCOUNTER — Encounter (HOSPITAL_COMMUNITY): Payer: Self-pay | Admitting: Internal Medicine
